# Patient Record
Sex: Female | Born: 1955 | Race: White | Hispanic: No | Smoking: Never smoker
Health system: Southern US, Community
[De-identification: ages and names within clinical notes are randomized; demographics above are authoritative.]

## PROBLEM LIST (undated history)

## (undated) DIAGNOSIS — K259 Gastric ulcer, unspecified as acute or chronic, without hemorrhage or perforation: Secondary | ICD-10-CM

## (undated) DIAGNOSIS — Z860100 Personal history of colon polyps, unspecified: Secondary | ICD-10-CM

## (undated) DIAGNOSIS — I10 Essential (primary) hypertension: Secondary | ICD-10-CM

## (undated) DIAGNOSIS — B009 Herpesviral infection, unspecified: Secondary | ICD-10-CM

## (undated) DIAGNOSIS — J189 Pneumonia, unspecified organism: Secondary | ICD-10-CM

## (undated) DIAGNOSIS — Z8719 Personal history of other diseases of the digestive system: Secondary | ICD-10-CM

## (undated) DIAGNOSIS — E785 Hyperlipidemia, unspecified: Secondary | ICD-10-CM

## (undated) DIAGNOSIS — K449 Diaphragmatic hernia without obstruction or gangrene: Secondary | ICD-10-CM

## (undated) DIAGNOSIS — N816 Rectocele: Secondary | ICD-10-CM

## (undated) DIAGNOSIS — Z87442 Personal history of urinary calculi: Secondary | ICD-10-CM

## (undated) DIAGNOSIS — D229 Melanocytic nevi, unspecified: Secondary | ICD-10-CM

## (undated) DIAGNOSIS — E039 Hypothyroidism, unspecified: Secondary | ICD-10-CM

## (undated) DIAGNOSIS — K5790 Diverticulosis of intestine, part unspecified, without perforation or abscess without bleeding: Secondary | ICD-10-CM

## (undated) HISTORY — PX: WISDOM TOOTH EXTRACTION: SHX21

## (undated) HISTORY — DX: Rectocele: N81.6

## (undated) HISTORY — DX: Diverticulosis of intestine, part unspecified, without perforation or abscess without bleeding: K57.90

## (undated) HISTORY — DX: Gastric ulcer, unspecified as acute or chronic, without hemorrhage or perforation: K25.9

## (undated) HISTORY — PX: DILATION AND CURETTAGE OF UTERUS: SHX78

## (undated) HISTORY — DX: Hypothyroidism, unspecified: E03.9

## (undated) HISTORY — DX: Personal history of other diseases of the digestive system: Z87.19

## (undated) HISTORY — DX: Personal history of urinary calculi: Z87.442

## (undated) HISTORY — DX: Herpesviral infection, unspecified: B00.9

## (undated) HISTORY — PX: POLYPECTOMY: SHX149

## (undated) HISTORY — DX: Pneumonia, unspecified organism: J18.9

## (undated) HISTORY — DX: Essential (primary) hypertension: I10

## (undated) HISTORY — DX: Hyperlipidemia, unspecified: E78.5

## (undated) HISTORY — DX: Personal history of colonic polyps: Z86.010

## (undated) HISTORY — DX: Personal history of colon polyps, unspecified: Z86.0100

## (undated) HISTORY — DX: Morbid (severe) obesity due to excess calories: E66.01

## (undated) HISTORY — PX: APPENDECTOMY: SHX54

---
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
## 1982-11-02 HISTORY — PX: OVARIAN CYST REMOVAL: SHX89

## 1992-11-02 HISTORY — PX: OTHER SURGICAL HISTORY: SHX169

## 2007-04-02 ENCOUNTER — Emergency Department (HOSPITAL_COMMUNITY): Admission: EM | Admit: 2007-04-02 | Discharge: 2007-04-02 | Payer: Self-pay | Admitting: Emergency Medicine

## 2007-08-31 ENCOUNTER — Encounter: Payer: Self-pay | Admitting: Internal Medicine

## 2007-08-31 ENCOUNTER — Ambulatory Visit: Payer: Self-pay | Admitting: Internal Medicine

## 2007-08-31 DIAGNOSIS — N912 Amenorrhea, unspecified: Secondary | ICD-10-CM | POA: Insufficient documentation

## 2007-08-31 DIAGNOSIS — E785 Hyperlipidemia, unspecified: Secondary | ICD-10-CM | POA: Insufficient documentation

## 2007-08-31 DIAGNOSIS — E1159 Type 2 diabetes mellitus with other circulatory complications: Secondary | ICD-10-CM

## 2007-08-31 DIAGNOSIS — I10 Essential (primary) hypertension: Secondary | ICD-10-CM

## 2007-08-31 DIAGNOSIS — F519 Sleep disorder not due to a substance or known physiological condition, unspecified: Secondary | ICD-10-CM | POA: Insufficient documentation

## 2007-08-31 DIAGNOSIS — E039 Hypothyroidism, unspecified: Secondary | ICD-10-CM | POA: Insufficient documentation

## 2007-08-31 DIAGNOSIS — I152 Hypertension secondary to endocrine disorders: Secondary | ICD-10-CM | POA: Insufficient documentation

## 2007-09-01 LAB — CONVERTED CEMR LAB
ALT: 27 units/L (ref 0–35)
AST: 23 units/L (ref 0–37)
Albumin: 4.2 g/dL (ref 3.5–5.2)
Bilirubin, Direct: 0.2 mg/dL (ref 0.0–0.3)
Calcium: 9.4 mg/dL (ref 8.4–10.5)
Chloride: 101 meq/L (ref 96–112)
Cholesterol: 192 mg/dL (ref 0–200)
Creatinine, Ser: 0.7 mg/dL (ref 0.4–1.2)
Eosinophils Relative: 1.8 % (ref 0.0–5.0)
GFR calc non Af Amer: 93 mL/min
Glucose, Bld: 106 mg/dL — ABNORMAL HIGH (ref 70–99)
HCT: 39.7 % (ref 36.0–46.0)
Hgb A1c MFr Bld: 6.3 % — ABNORMAL HIGH (ref 4.6–6.0)
Ketones, ur: NEGATIVE mg/dL
LDL Cholesterol: 127 mg/dL — ABNORMAL HIGH (ref 0–99)
Neutrophils Relative %: 64.6 % (ref 43.0–77.0)
Nitrite: NEGATIVE
Platelets: 239 10*3/uL (ref 150–400)
RBC: 4.45 M/uL (ref 3.87–5.11)
RDW: 13.2 % (ref 11.5–14.6)
Sodium: 138 meq/L (ref 135–145)
Specific Gravity, Urine: 1.01 (ref 1.000–1.03)
Total Bilirubin: 0.8 mg/dL (ref 0.3–1.2)
Total Protein, Urine: NEGATIVE mg/dL
Urine Glucose: NEGATIVE mg/dL
Urobilinogen, UA: 0.2 (ref 0.0–1.0)
VLDL: 24 mg/dL (ref 0–40)
WBC: 7.9 10*3/uL (ref 4.5–10.5)
pH: 7 (ref 5.0–8.0)

## 2007-10-24 ENCOUNTER — Encounter: Payer: Self-pay | Admitting: Internal Medicine

## 2007-11-02 ENCOUNTER — Telehealth: Payer: Self-pay | Admitting: Internal Medicine

## 2007-12-05 ENCOUNTER — Telehealth: Payer: Self-pay | Admitting: Internal Medicine

## 2008-02-02 ENCOUNTER — Telehealth (INDEPENDENT_AMBULATORY_CARE_PROVIDER_SITE_OTHER): Payer: Self-pay | Admitting: *Deleted

## 2008-07-12 ENCOUNTER — Encounter: Admission: RE | Admit: 2008-07-12 | Discharge: 2008-07-12 | Payer: Self-pay | Admitting: Family Medicine

## 2008-07-12 ENCOUNTER — Encounter: Payer: Self-pay | Admitting: Internal Medicine

## 2008-10-04 ENCOUNTER — Encounter: Payer: Self-pay | Admitting: Internal Medicine

## 2008-10-31 ENCOUNTER — Encounter (INDEPENDENT_AMBULATORY_CARE_PROVIDER_SITE_OTHER): Payer: Self-pay | Admitting: *Deleted

## 2008-12-12 ENCOUNTER — Ambulatory Visit: Payer: Self-pay | Admitting: Internal Medicine

## 2008-12-14 ENCOUNTER — Telehealth (INDEPENDENT_AMBULATORY_CARE_PROVIDER_SITE_OTHER): Payer: Self-pay | Admitting: *Deleted

## 2008-12-19 ENCOUNTER — Ambulatory Visit: Payer: Self-pay | Admitting: Internal Medicine

## 2008-12-20 ENCOUNTER — Encounter (INDEPENDENT_AMBULATORY_CARE_PROVIDER_SITE_OTHER): Payer: Self-pay | Admitting: *Deleted

## 2009-02-05 ENCOUNTER — Encounter: Admission: RE | Admit: 2009-02-05 | Discharge: 2009-02-05 | Payer: Self-pay | Admitting: Obstetrics and Gynecology

## 2009-02-15 ENCOUNTER — Encounter: Payer: Self-pay | Admitting: Internal Medicine

## 2009-02-20 ENCOUNTER — Ambulatory Visit: Payer: Self-pay | Admitting: Internal Medicine

## 2009-02-20 DIAGNOSIS — H04129 Dry eye syndrome of unspecified lacrimal gland: Secondary | ICD-10-CM | POA: Insufficient documentation

## 2009-02-20 DIAGNOSIS — D179 Benign lipomatous neoplasm, unspecified: Secondary | ICD-10-CM | POA: Insufficient documentation

## 2009-02-20 DIAGNOSIS — E119 Type 2 diabetes mellitus without complications: Secondary | ICD-10-CM

## 2009-03-08 ENCOUNTER — Telehealth (INDEPENDENT_AMBULATORY_CARE_PROVIDER_SITE_OTHER): Payer: Self-pay | Admitting: *Deleted

## 2009-05-20 ENCOUNTER — Encounter: Payer: Self-pay | Admitting: Internal Medicine

## 2009-05-22 ENCOUNTER — Ambulatory Visit: Payer: Self-pay | Admitting: Internal Medicine

## 2009-08-19 ENCOUNTER — Ambulatory Visit: Payer: Self-pay | Admitting: Internal Medicine

## 2009-09-03 ENCOUNTER — Encounter: Payer: Self-pay | Admitting: Internal Medicine

## 2009-09-03 ENCOUNTER — Ambulatory Visit: Payer: Self-pay | Admitting: Internal Medicine

## 2009-09-03 LAB — HM COLONOSCOPY

## 2009-09-04 ENCOUNTER — Encounter: Payer: Self-pay | Admitting: Internal Medicine

## 2009-10-02 HISTORY — PX: TEAR DUCT PROBING: SHX793

## 2009-11-02 HISTORY — PX: COLONOSCOPY W/ POLYPECTOMY: SHX1380

## 2009-11-18 ENCOUNTER — Encounter: Payer: Self-pay | Admitting: Internal Medicine

## 2009-11-21 ENCOUNTER — Ambulatory Visit: Payer: Self-pay | Admitting: Internal Medicine

## 2009-11-21 DIAGNOSIS — Z860100 Personal history of colon polyps, unspecified: Secondary | ICD-10-CM | POA: Insufficient documentation

## 2009-11-21 DIAGNOSIS — IMO0001 Reserved for inherently not codable concepts without codable children: Secondary | ICD-10-CM | POA: Insufficient documentation

## 2010-02-10 ENCOUNTER — Encounter: Admission: RE | Admit: 2010-02-10 | Discharge: 2010-02-10 | Payer: Self-pay | Admitting: Internal Medicine

## 2010-02-10 LAB — HM MAMMOGRAPHY: HM Mammogram: NORMAL

## 2010-03-04 ENCOUNTER — Encounter (INDEPENDENT_AMBULATORY_CARE_PROVIDER_SITE_OTHER): Payer: Self-pay | Admitting: *Deleted

## 2010-03-04 ENCOUNTER — Ambulatory Visit: Payer: Self-pay | Admitting: Internal Medicine

## 2010-03-04 DIAGNOSIS — IMO0002 Reserved for concepts with insufficient information to code with codable children: Secondary | ICD-10-CM | POA: Insufficient documentation

## 2010-03-04 DIAGNOSIS — R350 Frequency of micturition: Secondary | ICD-10-CM | POA: Insufficient documentation

## 2010-03-04 LAB — CONVERTED CEMR LAB
Bilirubin Urine: NEGATIVE
Blood in Urine, dipstick: NEGATIVE
Ketones, urine, test strip: NEGATIVE
Nitrite: NEGATIVE
Protein, U semiquant: NEGATIVE
Urobilinogen, UA: 0.2

## 2010-11-17 ENCOUNTER — Other Ambulatory Visit: Payer: Self-pay | Admitting: Internal Medicine

## 2010-11-17 ENCOUNTER — Ambulatory Visit
Admission: RE | Admit: 2010-11-17 | Discharge: 2010-11-17 | Payer: Self-pay | Source: Home / Self Care | Attending: Internal Medicine | Admitting: Internal Medicine

## 2010-11-17 DIAGNOSIS — R5381 Other malaise: Secondary | ICD-10-CM | POA: Insufficient documentation

## 2010-11-17 DIAGNOSIS — R5383 Other fatigue: Secondary | ICD-10-CM

## 2010-11-17 LAB — HEPATIC FUNCTION PANEL
ALT: 23 U/L (ref 0–35)
AST: 21 U/L (ref 0–37)
Albumin: 4.4 g/dL (ref 3.5–5.2)
Alkaline Phosphatase: 92 U/L (ref 39–117)
Bilirubin, Direct: 0.2 mg/dL (ref 0.0–0.3)
Total Bilirubin: 1 mg/dL (ref 0.3–1.2)
Total Protein: 7.1 g/dL (ref 6.0–8.3)

## 2010-11-17 LAB — CBC WITH DIFFERENTIAL/PLATELET
Basophils Absolute: 0 10*3/uL (ref 0.0–0.1)
Basophils Relative: 0.7 % (ref 0.0–3.0)
Eosinophils Absolute: 0.1 10*3/uL (ref 0.0–0.7)
Eosinophils Relative: 1.4 % (ref 0.0–5.0)
HCT: 45.4 % (ref 36.0–46.0)
Hemoglobin: 15.1 g/dL — ABNORMAL HIGH (ref 12.0–15.0)
Lymphocytes Relative: 28.4 % (ref 12.0–46.0)
Lymphs Abs: 1.9 10*3/uL (ref 0.7–4.0)
MCHC: 33.3 g/dL (ref 30.0–36.0)
MCV: 89.8 fl (ref 78.0–100.0)
Monocytes Absolute: 0.5 10*3/uL (ref 0.1–1.0)
Monocytes Relative: 6.7 % (ref 3.0–12.0)
Neutro Abs: 4.2 10*3/uL (ref 1.4–7.7)
Neutrophils Relative %: 62.8 % (ref 43.0–77.0)
Platelets: 221 10*3/uL (ref 150.0–400.0)
RBC: 5.05 Mil/uL (ref 3.87–5.11)
RDW: 14.2 % (ref 11.5–14.6)
WBC: 6.7 10*3/uL (ref 4.5–10.5)

## 2010-11-17 LAB — BUN: BUN: 18 mg/dL (ref 6–23)

## 2010-11-17 LAB — CREATININE, SERUM: Creatinine, Ser: 0.7 mg/dL (ref 0.4–1.2)

## 2010-11-17 LAB — LIPID PANEL
Cholesterol: 211 mg/dL — ABNORMAL HIGH (ref 0–200)
HDL: 40.9 mg/dL (ref >39.00)
Total CHOL/HDL Ratio: 5
Triglycerides: 193 mg/dL — ABNORMAL HIGH (ref 0.0–149.0)
VLDL: 38.6 mg/dL (ref 0.0–40.0)

## 2010-11-17 LAB — POTASSIUM: Potassium: 4.7 mEq/L (ref 3.5–5.1)

## 2010-11-17 LAB — LDL CHOLESTEROL, DIRECT: Direct LDL: 148.6 mg/dL

## 2010-11-19 ENCOUNTER — Telehealth: Payer: Self-pay | Admitting: Internal Medicine

## 2010-11-20 ENCOUNTER — Telehealth (INDEPENDENT_AMBULATORY_CARE_PROVIDER_SITE_OTHER): Payer: Self-pay | Admitting: *Deleted

## 2010-11-30 LAB — CONVERTED CEMR LAB
Creatinine,U: 39.7 mg/dL
Hgb A1c MFr Bld: 7.3 % — ABNORMAL HIGH (ref 4.6–6.0)
Microalb, Ur: 0.7 mg/dL (ref 0.0–1.9)
Potassium: 4 meq/L (ref 3.5–5.1)
TSH: 2.41 microintl units/mL (ref 0.35–5.50)
Total CK: 73 units/L (ref 7–177)

## 2010-12-02 ENCOUNTER — Telehealth: Payer: Self-pay | Admitting: Internal Medicine

## 2010-12-04 NOTE — Assessment & Plan Note (Signed)
Summary: FOR A FOLLOW UP PH   Vital Signs:  Patient profile:   55 year old female Weight:      269.4 pounds BMI:     42.35 Pulse rate:   64 / minute Resp:     15 per minute BP sitting:   138 / 92  (left arm) Cuff size:   large  Vitals Entered By: Shonna Chock CMA (November 17, 2010 8:13 AM) CC: Follow-up visit: Discuss NMR , Type 2 diabetes mellitus follow-up   CC:  Follow-up visit: Discuss NMR  and Type 2 diabetes mellitus follow-up.  History of Present Illness:      This is a 55 year old woman who presents for Type 2 diabetes mellitus follow-up.  The patient denies polyuria, polydipsia, blurred vision, self managed hypoglycemia, and numbness of extremities.  The patient denies the following symptoms: neuropathic pain, chest pain, vomiting, orthostatic symptoms, poor wound healing, intermittent claudication, vision loss, and foot ulcer.  Since the last visit the patient reports poor dietary compliance & 15# weight gain, exercising regularly (walking >3X/week), and monitoring blood glucose.  The patient has been measuring capillary blood glucose before breakfast (109-130). Two hrs after largest meal = < 140.  Since the last visit, the patient reports having had eye care by an ophthalmologist, no retinopathy was present. Microalbuminuria  has increased.Goals & risks discussed.                                                                                                                                                              She questions inadequate thyroid replacement due to + ROS as noted. Her mother was diagnosed with lymphoma @ 58 &  her MGF also had lymphoma as well ; this concerns her as she has been fatigued.  Current Medications (verified): 1)  Levoxyl 150 Mcg Tabs (Levothyroxine Sodium) .Marland Kitchen.. 1 By Mouth Once Daily**appointment Due** 2)  Metformin Hcl 500 Mg Xr24h-Tab (Metformin Hcl) .Marland Kitchen.. 1qd With Largest Meal 3)  Onetouch Ultra Test  Strp (Glucose Blood) .... Use As Directed 4)   Onetouch Lancets  Misc (Lancets) .... Use As Directed 5)  Melatonin 5 Mg Caps (Melatonin) .Marland Kitchen.. 1 By Mouth At Bedtime 6)  Vitamin D3 2000 Unit Caps (Cholecalciferol) .Marland Kitchen.. 1 By Mouth Once Daily 7)  Miralax Cap .Marland Kitchen.. 1 By Mouth Once Daily  Allergies: 1)  ! Meperidine Hcl (Meperidine Hcl) 2)  ! Erythromycin Base (Erythromycin Base) 3)  ! Codeine Sulfate (Codeine Sulfate) 4)  ! Cozaar  Review of Systems General:  Complains of fatigue; denies loss of appetite and sleep disorder. CV:  Denies palpitations. GI:  Complains of constipation. Derm:  Complains of changes in nail beds, dryness, and hair loss; Thinning  of eyebrows; nails brittle.  Physical Exam  General:  well-nourished,in  no acute distress; alert,appropriate and cooperative throughout examination Neck:  No deformities, masses, or tenderness noted. Lungs:  Normal respiratory effort, chest expands symmetrically. Lungs are clear to auscultation, no crackles or wheezes. Heart:  Normal rate and regular rhythm. S1 and S2 normal without gallop, murmur, click, rub.S4 Abdomen:  Bowel sounds positive,abdomen soft and non-tender without masses, organomegaly or hernias noted. Pulses:  R and L carotid,radial,dorsalis pedis and posterior tibial pulses are full and equal bilaterally Extremities:  No clubbing, cyanosis, edema, or deformity noted . Good nail health Neurologic:  alert & oriented X3, sensation intact to light touch over feet , and DTRs symmetrical and normal.   Skin:  Intact without suspicious lesions or rashes Cervical Nodes:  No lymphadenopathy noted Axillary Nodes:  No palpable lymphadenopathy Psych:  memory intact for recent and remote, normally interactive, and good eye contact.     Impression & Recommendations:  Problem # 1:  DIAB W/O COMP TYPE II/UNS NOT STATED UNCNTRL (ICD-250.00)  Her updated medication list for this problem includes:    Metformin Hcl 500 Mg Xr24h-tab (Metformin hcl) .Marland Kitchen... 1qd with largest  meal  Problem # 2:  FATIGUE (ICD-780.79)  Orders: Venipuncture (04540) TLB-CBC Platelet - w/Differential (85025-CBCD) TLB-Hepatic/Liver Function Pnl (80076-HEPATIC) TLB-Creatinine, Blood (82565-CREA) TLB-Potassium (K+) (84132-K) TLB-BUN (Urea Nitrogen) (84520-BUN) Specimen Handling (98119)  Problem # 3:  HYPOTHYROIDISM (ICD-244.9)  Her updated medication list for this problem includes:    Levoxyl 150 Mcg Tabs (Levothyroxine sodium) .Marland Kitchen... 1 by mouth once daily except 1& 1/2 on weds  Problem # 4:  LYMPHOMA, FAMILY HX (ICD-V16.7)  Orders: Venipuncture (14782) TLB-CBC Platelet - w/Differential (85025-CBCD) Specimen Handling (95621)  Problem # 5:  HYPERLIPIDEMIA (ICD-272.4)  Orders: Venipuncture (30865) TLB-Hepatic/Liver Function Pnl (80076-HEPATIC) TLB-Lipid Panel (80061-LIPID)  Complete Medication List: 1)  Levoxyl 150 Mcg Tabs (Levothyroxine sodium) .Marland Kitchen.. 1 by mouth once daily except 1& 1/2 on weds 2)  Metformin Hcl 500 Mg Xr24h-tab (Metformin hcl) .Marland Kitchen.. 1qd with largest meal 3)  Onetouch Ultra Test Strp (Glucose blood) .... Use as directed 4)  Onetouch Lancets Misc (Lancets) .... Use as directed 5)  Melatonin 5 Mg Caps (Melatonin) .Marland Kitchen.. 1 by mouth at bedtime 6)  Vitamin D3 2000 Unit Caps (Cholecalciferol) .Marland Kitchen.. 1 by mouth once daily 7)  Miralax Cap  .Marland Kitchen.. 1 by mouth once daily  Patient Instructions: 1)  Check TSH after increased dose as Rxed  in 8-9 weeks (244.9). Prescriptions: ONETOUCH LANCETS  MISC (LANCETS) use as directed  #100 x 3   Entered and Authorized by:   Marga Melnick MD   Signed by:   Marga Melnick MD on 11/17/2010   Method used:   Print then Give to Patient   RxID:   [id number] ONETOUCH ULTRA TEST  STRP (GLUCOSE BLOOD) Use as directed  #100 x 3   Entered and Authorized by:   Marga Melnick MD   Signed by:   Marga Melnick MD on 11/17/2010   Method used:   Print then Give to Patient   RxID:   [id number] LEVOXYL 150 MCG TABS (LEVOTHYROXINE  SODIUM) 1 by mouth once daily except 1& 1/2 on Weds  #90 x 1   Entered and Authorized by:   Marga Melnick MD   Signed by:   Marga Melnick MD on 11/17/2010   Method used:   Print then Give to Patient   RxID:   [id number]    Orders Added: 1)  Est. Patient Level IV [95188] 2)  Venipuncture [41660] 3)  TLB-CBC Platelet -  w/Differential [85025-CBCD] 4)  TLB-Hepatic/Liver Function Pnl [80076-HEPATIC] 5)  TLB-Creatinine, Blood [82565-CREA] 6)  TLB-Potassium (K+) [84132-K] 7)  TLB-BUN (Urea Nitrogen) [84520-BUN] 8)  TLB-Lipid Panel [80061-LIPID] 9)  Specimen Handling [99000]

## 2010-12-04 NOTE — Progress Notes (Signed)
Summary: question regarding new dose on Wed for Levoxyl  Phone Note Call from Patient Call back at cell = [phone]   Caller: Patient Summary of Call: patient says she was told to take an extra "half a pill" on Wednesday of her Thyroid medication--she says that this extra boost on Wednesday makes her feel terrible---she asks if she can get a prescription  for 25 m c g and break those in half and take one-half pill  (12.5 every day)    (her math is 12.5 x 7 = 87.5),     BUT----she is basing her math on her Thyroid dose of 175 m c g  , not 150 m c g  (one-half of 175 = 87.5)  please [facility] [place], [place] Initial call taken by: Jerolyn Shin,  November 19, 2010 11:22 AM  Follow-up for Phone Call        Please advise. Follow-up by: Lucious Groves CMA,  November 19, 2010 11:31 AM  Additional Follow-up for Phone Call Additional follow up Details #1::        Change to 150 micrograms once daily PLUS 1/2 of 25 micrograms once daily ; check TSH in 9 weeks with A1c (244.9, 250.00). See Rx to FAX Additional Follow-up by: Marga Melnick MD,  November 20, 2010 6:41 AM    Additional Follow-up for Phone Call Additional follow up Details #2::    Patient aware of the above and most recent lab results. Sent Pravastatin per patient request. Lucious Groves CMA  November 20, 2010 9:51 AM   New/Updated Medications: LEVOXYL 150 MCG TABS (LEVOTHYROXINE SODIUM) 1 by mouth once daily  plus  1/2  of 25 micrograms pill once daily LEVOXYL 25 MCG TABS (LEVOTHYROXINE SODIUM) 1/2 once daily with 150 micrograms pill Prescriptions: PRAVASTATIN SODIUM 20 MG TABS (PRAVASTATIN SODIUM) 1 at bedtime  #30 x 2   Entered by:   Lucious Groves CMA   Authorized by:   Marga Melnick MD   Signed by:   Lucious Groves CMA on 11/20/2010   Method used:   Electronically to        [facility]  [phone]* (retail)       [address] [place], [place]  [address]       Ph: [phone] or  [phone]       Fax: [phone]   RxID:   [id number] LEVOXYL 150 MCG TABS (LEVOTHYROXINE SODIUM) 1 by mouth once daily  plus  1/2  of 25 micrograms pill once daily  #90 x 0   Entered by:   Lucious Groves CMA   Authorized by:   Marga Melnick MD   Signed by:   Lucious Groves CMA on 11/20/2010   Method used:   Electronically to        [facility]  [phone]* (retail)       [address] [place], [place]  [address]       Ph: [phone] or [phone]       Fax: [phone]   RxID:   [id number] LEVOXYL 25 MCG TABS (LEVOTHYROXINE SODIUM) 1/2 once daily with 150 micrograms pill  #45 x 0   Entered by:   Lucious Groves CMA   Authorized by:   Marga Melnick MD   Signed by:   Lucious Groves CMA on 11/20/2010   Method used:   Electronically to  [facility]  [phone]* (retail)       [address] [place], [place]  [address]       Ph: [phone] or [phone]       Fax: [phone]   RxID:   [id number]

## 2010-12-04 NOTE — Progress Notes (Signed)
Summary: will have labs drawn at [facility] for 3/14 O/V with Hopp  ---- Converted from flag ---- ---- 11/20/2010 9:55 AM, Lucious Groves CMA wrote: I just spoke with the patient and she notes that she had an appt in 9 weeks. Patient needs lab appt prior to that for (Lipids, hepatic panel, TSH, and A1c; 272.4, 995.20, 244.9, 250.00). Please call her to schedule. [phone] ------------------------------       Additional Follow-up for Phone Call Additional follow up Details #2::    called patient to schedule these labs before she sees Dr Alwyn Ren on 3/14----she says that she always has these labs at [facility] where she works and she will bring results to the 3/14 office visit with Dr Alwyn Ren Follow-up by: Jerolyn Shin,  November 20, 2010 4:53 PM
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
## 2010-12-04 NOTE — Letter (Signed)
Summary: Work Dietitian at [employer]  [address] [place], [place] [address]   Phone: [phone]  Fax: [phone]    Today's Date: Mar 04, 2010  Name of Patient: Hannah Parsons  The above named patient had a medical visit today at:  8:30am / pm.  Please take this into consideration when reviewing the time away from work/school.    Special Instructions:  [ X ] None  [  ] To be off the remainder of today, returning to the normal work / school schedule tomorrow.  [  ] To be off until the next scheduled appointment on ______________________.  [  ] Other ________________________________________________________________ ________________________________________________________________________   Sincerely yours,   Shonna Chock

## 2010-12-04 NOTE — Assessment & Plan Note (Signed)
Summary: blood in ear/cbs   Vital Signs:  Patient profile:   55 year old female Weight:      258.6 pounds BMI:     40.65 Temp:     98.6 degrees F oral Pulse rate:   76 / minute Resp:     16 per minute BP sitting:   130 / 82  (left arm) Cuff size:   large  Vitals Entered By: Shonna Chock (Mar 04, 2010 8:39 AM) CC: Left ear feels funny but right ear had blood in it, patient noticed today.  Comments REVIEWED MED LIST, PATIENT AGREED DOSE AND INSTRUCTION CORRECT    CC:  Left ear feels funny but right ear had blood in it and patient noticed today. Marland Kitchen  History of Present Illness: Scant bright red blood noted on Q tip this am; she had noted itching prompting her to employ the Q tip. She is concerned as leaving for [facility] , [place] in am.She uses Eucerin as needed for eczema.  Allergies: 1)  ! Meperidine Hcl (Meperidine Hcl) 2)  ! Erythromycin Base (Erythromycin Base) 3)  ! Codeine Sulfate (Codeine Sulfate) 4)  ! Cozaar  Review of Systems General:  Denies chills, fever, sweats, and weight loss. ENT:  Denies earache, nasal congestion, nosebleeds, and sinus pressure; No frontal headache , facial pain or purulence. Resp:  Denies cough, coughing up blood, and sputum productive. GI:  Denies bloody stools and dark tarry stools. GU:  Complains of urinary frequency; denies discharge, dysuria, and hematuria; Nocturia X 1. Heme:  Denies abnormal bruising and bleeding.  Physical Exam  General:  n no acute distress; alert,appropriate and cooperative throughout examination Ears:  R external ear exam shows  tiny area of resolved hemorrhage inferior. Otoscopic examination reveals clear canals, tympanic membranes are intact bilaterally without bulging, retraction, inflammation or discharge. Hearing is grossly normal bilaterally. TMs dull Nose:  External nasal examination shows no deformity or inflammation. Nasal mucosa are pink and moist without lesions or exudates. Mouth:  Oral mucosa and oropharynx  without lesions or exudates.  Teeth in good repair. Lungs:  Normal respiratory effort, chest expands symmetrically. Lungs are clear to auscultation, no crackles or wheezes. Skin:  Intact without suspicious lesions or rashes Cervical Nodes:  No lymphadenopathy noted Axillary Nodes:  No palpable lymphadenopathy   Impression & Recommendations:  Problem # 1:  ABRASION (ICD-919.0) minor external canal hemorrhage (resolved),probably from foreign body being  rubbed against dry  tissues by Q tip  Problem # 2:  URINARY FREQUENCY (ICD-788.41)  Orders: UA Dipstick w/o Micro (manual) (81191)  Complete Medication List: 1)  Levoxyl 150 Mcg Tabs (Levothyroxine sodium) .Marland Kitchen.. 1 by mouth once daily 2)  Metformin Hcl 500 Mg Xr24h-tab (Metformin hcl) .Marland Kitchen.. 1qd with largest meal 3)  Onetouch Ultra Test Strp (Glucose blood) .... Use as directed 4)  Onetouch Lancets Misc (Lancets) .... Use as directed 5)  Melatonin 5 Mg Caps (Melatonin) .Marland Kitchen.. 1 by mouth at bedtime  Patient Instructions: 1)  Discontinue Q tips; use CortAid or Eucerin as needed  for itching in ear  canal. 2)  Drink as much fluid as you can tolerate for the next few days.  Laboratory Results   Urine Tests    Routine Urinalysis   Color: yellow Appearance: Clear Glucose: negative   (Normal Range: Negative) Bilirubin: negative   (Normal Range: Negative) Ketone: negative   (Normal Range: Negative) Spec. Gravity: 1.010   (Normal Range: 1.003-1.035) Blood: negative   (Normal Range: Negative) pH: 6.0   (  Normal Range: 5.0-8.0) Protein: negative   (Normal Range: Negative) Urobilinogen: 0.2   (Normal Range: 0-1) Nitrite: negative   (Normal Range: Negative) Leukocyte Esterace: negative   (Normal Range: Negative)

## 2010-12-08 ENCOUNTER — Encounter: Payer: Self-pay | Admitting: Internal Medicine

## 2010-12-08 ENCOUNTER — Ambulatory Visit (INDEPENDENT_AMBULATORY_CARE_PROVIDER_SITE_OTHER): Payer: 59 | Admitting: Internal Medicine

## 2010-12-08 DIAGNOSIS — E039 Hypothyroidism, unspecified: Secondary | ICD-10-CM

## 2010-12-08 DIAGNOSIS — I10 Essential (primary) hypertension: Secondary | ICD-10-CM

## 2010-12-08 DIAGNOSIS — N852 Hypertrophy of uterus: Secondary | ICD-10-CM

## 2010-12-08 DIAGNOSIS — D259 Leiomyoma of uterus, unspecified: Secondary | ICD-10-CM

## 2010-12-08 DIAGNOSIS — E785 Hyperlipidemia, unspecified: Secondary | ICD-10-CM

## 2010-12-08 DIAGNOSIS — Z01818 Encounter for other preprocedural examination: Secondary | ICD-10-CM

## 2010-12-10 NOTE — Progress Notes (Signed)
Summary: Thyroid rx--[facility]  Phone Note From Pharmacy   Caller: [facility] Summary of Call: MD spoke with [facility] today about patient thyroid meds and it is to be dispensed how he wrote it. [facility] aware and will shipt #90 of both doses to the patient. Lucious Groves CMA  December 02, 2010 11:07 AM

## 2010-12-18 NOTE — Assessment & Plan Note (Signed)
Summary: clearanc for surgery/cbs   Vital Signs:  Patient profile:   55 year old female Height:      66.5 inches Weight:      265.8 pounds Temp:     98.4 degrees F oral Pulse rate:   76 / minute Resp:     14 per minute BP sitting:   132 / 88  (left arm) Cuff size:   large  Vitals Entered By: Shonna Chock CMA (December 08, 2010 9:04 AM) CC: Surgical clearance- Total Hysterectomy, Pre-op Evaluation   CC:  Surgical clearance- Total Hysterectomy and Pre-op Evaluation.  History of Present Illness:    Dr Doug Sou , [place] , [place] has requested a pre op evaluation . Laparoscopic surgey is planned 12/29/2010 for irregular vaginal bleeding due to fibroids &  uterine hyperplasia.She  denies respiratory , GI bleeding,   cardiac symptoms,  significant  ETOH use or  smoking. See ROS . She is on  diabetes meds.  There is no history of antiplatelet agents, chronic steroids ( steroid injection last week R foot), warfarin, bleeding  or clotting disorder ( except for the Gyn issues), and PMH of  anesthesia reaction.  She is walking 1 mpd @ 2.4 mph  with incline of 2% once daily w/o symptoms. Her father & PGM had CHF; no FH MI or CVA.  Allergies: 1)  ! Meperidine Hcl (Meperidine Hcl) 2)  ! Erythromycin Base (Erythromycin Base) 3)  ! Codeine Sulfate (Codeine Sulfate) 4)  ! Cozaar  Review of Systems General:  Denies chills, fatigue, fever, and sweats; Weight loss is purposeful. ENT:  Denies difficulty swallowing, hoarseness, and nosebleeds. CV:  Denies chest pain or discomfort, difficulty breathing at night, difficulty breathing while lying down, leg cramps with exertion, palpitations, shortness of breath with exertion, swelling of feet, and swelling of hands. Resp:  Denies cough, coughing up blood, shortness of breath, and sputum productive. GI:  Denies abdominal pain, bloody stools, dark tarry stools, indigestion, nausea, and vomiting. GU:  Denies discharge, dysuria, and hematuria. Heme:  Denies  abnormal bruising.  Physical Exam  General:  well-nourished,in no acute distress; alert,appropriate and cooperative throughout examination Eyes:  No corneal or conjunctival inflammation noted. No icterus Mouth:  Oral mucosa and oropharynx without lesions or exudates.  Teeth in good repair. No pharyngeal erythema.   Neck:  No deformities, masses, or tenderness noted. Lungs:  Normal respiratory effort, chest expands symmetrically. Lungs are clear to auscultation, no crackles or wheezes. Heart:  Normal rate and regular rhythm. S1 and S2 normal without gallop, murmur, click, rub . S4 Abdomen:  Bowel sounds positive,abdomen soft and non-tender without masses, organomegaly or hernias noted. Pulses:  R and L carotid,radial,dorsalis pedis and posterior tibial pulses are full and equal bilaterally Extremities:  No clubbing, cyanosis, edema. Neurologic:  alert & oriented X3 and DTRs symmetrical and normal.   Skin:  Intact without suspicious lesions or rashes Cervical Nodes:  No lymphadenopathy noted Axillary Nodes:  No palpable lymphadenopathy Psych:  memory intact for recent and remote, normally interactive, and good eye contact.     Impression & Recommendations:  Problem # 1:  PREOPERATIVE EXAMINATION (ICD-V72.84)  Problem # 2:  FIBROIDS, UTERUS (ICD-218.9)  Problem # 3:  HYPERTROPHY OF UTERUS (ICD-621.2)  Problem # 4:  DIAB W/O COMP TYPE II/UNS NOT STATED UNCNTRL (ICD-250.00)  Her updated medication list for this problem includes:    Metformin Hcl 500 Mg Xr24h-tab (Metformin hcl) .Marland Kitchen... 1qd with largest meal  Problem #  5:  HYPOTHYROIDISM (ICD-244.9) thyroid dose recently adjusted Her updated medication list for this problem includes:    Levoxyl 150 Mcg Tabs (Levothyroxine sodium) .Marland Kitchen... 1 by mouth once daily  plus  1/2  of 25 micrograms pill once daily    Levoxyl 25 Mcg Tabs (Levothyroxine sodium) .Marland Kitchen... 1/2 once daily with 150 micrograms pill  Problem # 6:  HYPERTENSION (ICD-401.9)   Good BP control.Minor new  NS ST-T changes ( not diagnosed by computer) ; no symptoms with CVE (treadmill)  Problem # 7:  HYPERLIPIDEMIA (ICD-272.4)  Her updated medication list for this problem includes:    Pravastatin Sodium 20 Mg Tabs (Pravastatin sodium) .Marland Kitchen... 1 at bedtime  Complete Medication List: 1)  Levoxyl 150 Mcg Tabs (Levothyroxine sodium) .Marland Kitchen.. 1 by mouth once daily  plus  1/2  of 25 micrograms pill once daily 2)  Metformin Hcl 500 Mg Xr24h-tab (Metformin hcl) .Marland Kitchen.. 1qd with largest meal 3)  Onetouch Ultra Test Strp (Glucose blood) .... Use as directed 4)  Onetouch Lancets Misc (Lancets) .... Use as directed 5)  Melatonin 5 Mg Caps (Melatonin) .Marland Kitchen.. 1 by mouth at bedtime 6)  Vitamin D3 2000 Unit Caps (Cholecalciferol) .Marland Kitchen.. 1 by mouth once daily 7)  Miralax Cap  .Marland Kitchen.. 1 by mouth once daily 8)  Pravastatin Sodium 20 Mg Tabs (Pravastatin sodium) .Marland Kitchen.. 1 at bedtime 9)  Levoxyl 25 Mcg Tabs (Levothyroxine sodium) .... 1/2 once daily with 150 micrograms pill  Other Orders: EKG w/ Interpretation (93000)  Patient Instructions: 1)  Tight Diabetes control should be avoided perioperatively ( see below). Telemetry required perioperatively. Share records with Dr Doug Sou. 2)  Check your blood sugars regularly. If your readings are usually above :150  or below 90 you should contact our office.   Orders Added: 1)  Est. Patient Level IV [09811] 2)  EKG w/ Interpretation [93000]

## 2010-12-22 ENCOUNTER — Telehealth: Payer: Self-pay | Admitting: Internal Medicine

## 2010-12-30 NOTE — Progress Notes (Signed)
Summary: Forms  Phone Note Call from Patient Call back at [phone]   Caller: Patient Summary of Call: PT IS CALLING ABOUT FORMS THAT SHE SAID WERE SUPPOSED TO BE FAXED FOR HER SURGERY. PLEASE CONTACT PATIENT AT [phone] EXT [phone] Initial call taken by: Lavell Islam,  December 22, 2010 10:51 AM  Follow-up for Phone Call        Please advise. Lucious Groves CMA  December 22, 2010 10:55 AM   Additional Follow-up for Phone Call Additional follow up Details #1::        FAX last OV clearing her for surgery Additional Follow-up by: Marga Melnick MD,  December 22, 2010 1:51 PM    Additional Follow-up for Phone Call Additional follow up Details #2::    Patient notified and states that there are specific forms and she will have them fax it to me. Please fax notes to Gearldine Bienenstock at [phone]. Lucious Groves CMA  December 22, 2010 2:45 PM

## 2011-01-14 ENCOUNTER — Ambulatory Visit: Payer: Self-pay | Admitting: Internal Medicine

## 2011-01-26 ENCOUNTER — Encounter (INDEPENDENT_AMBULATORY_CARE_PROVIDER_SITE_OTHER): Payer: Self-pay | Admitting: *Deleted

## 2011-01-28 ENCOUNTER — Encounter: Payer: Self-pay | Admitting: Internal Medicine

## 2011-01-28 ENCOUNTER — Ambulatory Visit (INDEPENDENT_AMBULATORY_CARE_PROVIDER_SITE_OTHER): Payer: 59 | Admitting: Internal Medicine

## 2011-01-28 DIAGNOSIS — E785 Hyperlipidemia, unspecified: Secondary | ICD-10-CM

## 2011-01-28 DIAGNOSIS — E039 Hypothyroidism, unspecified: Secondary | ICD-10-CM

## 2011-01-28 DIAGNOSIS — E119 Type 2 diabetes mellitus without complications: Secondary | ICD-10-CM

## 2011-01-28 NOTE — Progress Notes (Signed)
  Subjective:    Patient ID: Hannah Parsons, female    DOB: Mar 24, 1956, 55 y.o.   MRN: [id number]  HPI she returns for followup of labs. Her thyroid dose has been increased to 1 pill 150 mcg daily +1/2 of a 25 mcg pill daily for a total dose of 162.5 mcg. Her TSH is in the ideal range at 1.5-1.  Followup lipids revealed an LDL of 112. Triglycerides are still elevated at 200. HDL is 40; goals were discussed. Specifically  HDL goal = greater than 50; triglycerides goal is < 150; and LDL less than 100, ideally less than 70.  Pravastatin 20 mg was recommended after the last visit  based on presence of  DM & LDL value. She categorically states that she does not want to take statins unless  the elevated lipids are   "life threatening".    Review of Systems     Objective:   Physical Exam  on exam she is well-nourished; in no obvious distress. Skin is warm and dry w/o lesions.  She exhibits an S4 without murmurs or gallops.  All pulses are intact; no bruits are noted. She is intelligent and focused.  Light touch is normal over  the feet. Nail health is normal.          Assessment & Plan:  #1 hypothyroidism well controlled on the present dose  #2 diabetes; A1c of  6.4 indicates excellent control  #3 dyslipidemia; the standard would be an LDL less than 100 ideally less than 70 and HDL over 50. She exhibits an aversion to statins.  Plan: #1 I will recommend that an advanced cholesterol panel ([place] heart panel, 1304X) be performed here in 10 weeks. An A1c to be done through her office at that time. I would ask her to see me after we get the results of the events panel and she's had a chance to review it. This would optimally assess her genetic long-term cardiovascular risk.

## 2011-01-28 NOTE — Patient Instructions (Signed)
In 10 weeks please have an A1c done at you  office. (250.00).  At the same time please drop by for a fasting advanced cholesterol panel ([place] heart panel, 1304X). The code for this test would be 272.4 and 250.00. Please bring the booklet on the [facility] heart panel and the NMR lipoprotein to the followup visit.

## 2011-02-03 ENCOUNTER — Encounter: Payer: Self-pay | Admitting: Internal Medicine

## 2011-02-03 NOTE — Letter (Signed)
Summary: New Patient letter  [facility]  [address]   [place], [place] [address]   Phone: [phone]  Fax: [phone]       01/26/2011 MRN: [id number]  [facility] [address] [place], [place]  [address]  [place]  Dear Ms. Hannah Parsons,  Welcome to the Gastroenterology Division at [facility].    You are scheduled to see Dr.  Marina Goodell on 03/10/2011 at 9:15 on the 3rd floor at [facility], [address]  We ask that you try to arrive at our office 15 minutes prior to your appointment time to allow for check-in.  We would like you to complete the enclosed self-administered evaluation form prior to your visit and bring it with you on the day of your appointment.  We will review it with you.  Also, please bring a complete list of all your medications or, if you prefer, bring the medication bottles and we will list them.  Please bring your insurance card so that we may make a copy of it.  If your insurance requires a referral to see a specialist, please bring your referral form from your primary care physician.  Co-payments are due at the time of your visit and may be paid by cash, check or credit card.     Your office visit will consist of a consult with your physician (includes a physical exam), any laboratory testing he/she may order, scheduling of any necessary diagnostic testing (e.g. x-ray, ultrasound, CT-scan), and scheduling of a procedure (e.g. Endoscopy, Colonoscopy) if required.  Please allow enough time on your schedule to allow for any/all of these possibilities.    If you cannot keep your appointment, please call [phone] to cancel or reschedule prior to your appointment date.  This allows [place] the opportunity to schedule an appointment for another patient in need of care.  If you do not cancel or reschedule by 5 p.m. the business day prior to your appointment date, you will be charged a $50.00 late cancellation/no-show fee.    Thank you for choosing  [facility] for your medical needs.  We appreciate the opportunity to care for you.  Please visit [place] at our website  to learn more about our practice.                     Sincerely,                                                             The Gastroenterology Division

## 2011-02-04 LAB — GLUCOSE, CAPILLARY
Glucose-Capillary: 100 mg/dL — ABNORMAL HIGH (ref 70–99)
Glucose-Capillary: 87 mg/dL (ref 70–99)

## 2011-02-05 ENCOUNTER — Other Ambulatory Visit: Payer: Self-pay | Admitting: *Deleted

## 2011-02-05 MED ORDER — LEVOTHYROXINE SODIUM 25 MCG PO TABS: 25.0000 ug | ORAL_TABLET | Freq: Every day | ORAL | Status: DC

## 2011-02-05 MED ORDER — LEVOTHYROXINE SODIUM 150 MCG PO TABS: 150.0000 ug | ORAL_TABLET | Freq: Every day | ORAL | Status: DC

## 2011-02-24 ENCOUNTER — Other Ambulatory Visit: Payer: Self-pay | Admitting: *Deleted

## 2011-02-24 MED ORDER — METFORMIN HCL ER 500 MG PO TB24: 500.0000 mg | ORAL_TABLET | Freq: Every day | ORAL | Status: DC

## 2011-03-10 ENCOUNTER — Ambulatory Visit (INDEPENDENT_AMBULATORY_CARE_PROVIDER_SITE_OTHER): Payer: 59 | Admitting: Internal Medicine

## 2011-03-10 ENCOUNTER — Encounter: Payer: Self-pay | Admitting: Internal Medicine

## 2011-03-10 VITALS — BP 128/90 | HR 80 | Ht 66.0 in | Wt 272.0 lb

## 2011-03-10 DIAGNOSIS — K5900 Constipation, unspecified: Secondary | ICD-10-CM

## 2011-03-10 DIAGNOSIS — E119 Type 2 diabetes mellitus without complications: Secondary | ICD-10-CM

## 2011-03-10 DIAGNOSIS — R1310 Dysphagia, unspecified: Secondary | ICD-10-CM

## 2011-03-10 NOTE — Progress Notes (Signed)
HISTORY OF PRESENT ILLNESS:  Hannah Parsons is a 55 y.o. female with hypertension, hypothyroidism, hyperlipidemia, diabetes mellitus, obesity, and adenomatous colon polyps. She presents today with a new complaint of dysphagia. She was seen on one other occasion, as a direct referral for screening colonoscopy on 09/03/2009. Multiple colon polyps found and removed. Followup in 3 years recommend. Her current history is that of choking episodes with liquids and intermittent solid food dysphagia items like bread for about one year. This has worsened in terms of frequency, over time. She does have rare reflux symptoms such as pyrosis. On no GERD meds. 10 pound weight gain over the past year. Note, pain. Problems with constipation which are managed with MiraLax. Chronic medical problems stable.  REVIEW OF SYSTEMS:  All non-GI ROS negative.  Past Medical History  Diagnosis Date  . Hypertension   . Hypothyroidism   . Hyperlipidemia   . Diabetes mellitus   . History of cholelithiasis   . History of nephrolithiasis   . History of colon polyps     hyperplastic    Past Surgical History  Procedure Date  . Dilation and curettage of uterus     Dr.Gaccione  . Appendectomy   . Ovarian cyst removal 1984  . Colonoscopy w/ polypectomy     2 adenmas & 1 hyperplastic  polyp, Due 2013. Dr.Perry  . Tear duct probing 10/2009    Social History Hannah Parsons  reports that she has never smoked. She has never used smokeless tobacco. She reports that she drinks alcohol. She reports that she does not use illicit drugs.  family history includes Colon cancer (age of onset:90) in her maternal grandfather; Esophageal cancer in her maternal grandmother; Hypertension in an unspecified family member; Lymphoma in her mother; and Lymphoma (age of onset:82) in her maternal grandfather.  Allergies  Allergen Reactions  . Codeine Sulfate     REACTION: hives  . Erythromycin Base     REACTION: stomach cramps  . Losartan  Potassium     REACTION: PATIENT DIDNT FEEL WELL WHILE TAKING; intercostal cp, fatigue  . Meperidine Hcl     REACTION: vomiting       PHYSICAL EXAMINATION: Vital signs: BP 128/90  Pulse 80  Ht 5\' 6"  (1.676 m)  Wt 272 lb (123.378 kg)  BMI 43.90 kg/m2  Constitutional: generally well-appearing but obese, no acute distress Psychiatric: alert and oriented x3, cooperative Eyes: extraocular movements intact, anicteric, conjunctiva pink Mouth: oral pharynx moist, no lesions Neck: supple no lymphadenopathy Cardiovascular: heart regular rate and rhythm, no murmur Lungs: clear to auscultation bilaterally Abdomen: soft, nontender, nondistended, no obvious ascites, no peritoneal signs, normal bowel sounds, no organomegaly Extremities: no lower extremity edema bilaterally Skin: no lesions on visible extremities Neuro: No focal deficits.   ASSESSMENT:  #1. Intermittent somewhat progressive problems with liquid (choking) and solid food dysphagia. Rule out stricture or ring. Rule out dysmotility. #2. Diabetes mellitus #3. History of multiple adenomatous polyps on colonoscopy November 2010 #4. Constipation   PLAN:  #1. Upper endoscopy with possible esophageal dilation.The nature of the procedure, as well as the risks, benefits, and alternatives were carefully and thoroughly reviewed with the patient. Ample time for discussion and questions allowed. The patient understood, was satisfied, and agreed to proceed.  #2. Patient takes diabetic medication at night. No adjustment needed #3. If endoscopy is unrevealing, then consider swallowing evaluation or motility study #4. Surveillance colonoscopy due to a round of November 2013. Patient aware #5. Continue MiraLax when necessary

## 2011-03-10 NOTE — Patient Instructions (Signed)
EGD scheduled for 03/25/11 2:00 pm arrive at 1:00 pm  Endoscopy brochure given for you to review. Please do not take your diabetic medication until after procedure.

## 2011-03-16 ENCOUNTER — Other Ambulatory Visit: Payer: Self-pay | Admitting: Internal Medicine

## 2011-03-17 NOTE — Telephone Encounter (Signed)
244.9 TSH 

## 2011-03-25 ENCOUNTER — Other Ambulatory Visit: Payer: 59 | Admitting: Internal Medicine

## 2011-04-01 ENCOUNTER — Telehealth: Payer: Self-pay | Admitting: Internal Medicine

## 2011-04-01 NOTE — Telephone Encounter (Signed)
Sorry to hear that... No charge  

## 2011-04-02 ENCOUNTER — Other Ambulatory Visit: Payer: 59 | Admitting: Internal Medicine

## 2011-04-08 ENCOUNTER — Other Ambulatory Visit (INDEPENDENT_AMBULATORY_CARE_PROVIDER_SITE_OTHER): Payer: 59

## 2011-04-08 DIAGNOSIS — R1310 Dysphagia, unspecified: Secondary | ICD-10-CM

## 2011-04-08 NOTE — Progress Notes (Signed)
Labs only

## 2011-04-13 ENCOUNTER — Other Ambulatory Visit: Payer: Self-pay | Admitting: Internal Medicine

## 2011-04-22 ENCOUNTER — Other Ambulatory Visit: Payer: 59 | Admitting: Internal Medicine

## 2011-04-27 ENCOUNTER — Other Ambulatory Visit: Payer: Self-pay | Admitting: Internal Medicine

## 2011-04-27 DIAGNOSIS — Z1231 Encounter for screening mammogram for malignant neoplasm of breast: Secondary | ICD-10-CM

## 2011-04-28 ENCOUNTER — Encounter: Payer: Self-pay | Admitting: Internal Medicine

## 2011-05-03 HISTORY — PX: CHOLECYSTECTOMY, LAPAROSCOPIC: SHX56

## 2011-05-04 ENCOUNTER — Other Ambulatory Visit: Payer: Self-pay | Admitting: Internal Medicine

## 2011-05-05 ENCOUNTER — Ambulatory Visit
Admission: RE | Admit: 2011-05-05 | Discharge: 2011-05-05 | Disposition: A | Discharge status: Home / Self Care | Payer: 59 | Source: Ambulatory Visit | Attending: Internal Medicine | Admitting: Internal Medicine

## 2011-05-05 DIAGNOSIS — Z1231 Encounter for screening mammogram for malignant neoplasm of breast: Secondary | ICD-10-CM

## 2011-05-11 ENCOUNTER — Ambulatory Visit (INDEPENDENT_AMBULATORY_CARE_PROVIDER_SITE_OTHER): Payer: 59 | Admitting: Internal Medicine

## 2011-05-11 ENCOUNTER — Encounter: Payer: Self-pay | Admitting: Internal Medicine

## 2011-05-11 DIAGNOSIS — E8881 Metabolic syndrome: Secondary | ICD-10-CM

## 2011-05-11 DIAGNOSIS — I10 Essential (primary) hypertension: Secondary | ICD-10-CM

## 2011-05-11 DIAGNOSIS — E039 Hypothyroidism, unspecified: Secondary | ICD-10-CM

## 2011-05-11 DIAGNOSIS — E785 Hyperlipidemia, unspecified: Secondary | ICD-10-CM

## 2011-05-11 MED ORDER — LEVOTHYROXINE SODIUM 25 MCG PO TABS: 25.0000 ug | ORAL_TABLET | Freq: Every day | ORAL | Status: DC

## 2011-05-11 MED ORDER — LEVOTHYROXINE SODIUM 150 MCG PO TABS: 150.0000 ug | ORAL_TABLET | Freq: Every day | ORAL | Status: DC

## 2011-05-11 MED ORDER — METFORMIN HCL ER 500 MG PO TB24: 500.0000 mg | ORAL_TABLET | Freq: Two times a day (BID) | ORAL | Status: DC

## 2011-05-11 MED ORDER — PRAVASTATIN SODIUM 20 MG PO TABS: 20.0000 mg | ORAL_TABLET | Freq: Every evening | ORAL | Status: DC

## 2011-05-11 NOTE — Patient Instructions (Signed)
Preventive Health Care: Exercise  30-45  minutes a day, 3-4 days a week. Walking is especially valuable in preventing Osteoporosis. Eat a low-fat diet with lots of fruits and vegetables, up to 7-9 servings per day. Avoid obesity; your goal = waist less than 35 inches.Consume less than 30 grams of sugar per day from foods & drinks with High Fructose Corn Syrup as #1, 2,3 or #4 on label. Please  schedule fasting Labs : Lipids, hepatic panel, CK,A1c, insulin level in 10 weeks .

## 2011-05-11 NOTE — Progress Notes (Signed)
Subjective:    Patient ID: Hannah Parsons, female    DOB: Sep 20, 1956, 55 y.o.   MRN: [id number]  HPI  #1 Dyslipidemia assessment: Prior Advanced Lipid Testing: NMR Lipoprofile .   Family history of premature CAD/ MI: no .  Nutrition: not specific but low sugar .  Exercise: walking daily up to 1/2 mpd . Diabetes : A1c 6.1 % on 6/15 but insulin level 30. Marland Kitchen HTN: controlled @ home, average 122/78. Smoking history  : never .   Weight :  stable. ROS: fatigue: no ;  abd pain/bowel changes: no ; myalgias:no(CK 87);  syncope : no ; memory loss: no;skin changes: no. Lab results reviewed :[place] Heart panel risks: LDL 127, HDL < 50. Options discussed.  #2Diabetes status assessment: Fasting or morning glucose range:  110-125 or average :  118  . Highest glucose 2 hours after any meal:  < 140. Hypoglycemia :  no .                                                     Excess thirst :no;  Excess hunger:  no ;  Excess urination:  no.                                  Lightheadedness with standing:  no. Chest pain:  no ; Palpitations :non exertional intermittently ;  Pain in  calves with walking:  no .                                                                                                                                 Non healing skin  ulcers or sores,especially over the feet:  no. Numbness or tingling or burning in feet : no .                                                                                                                                              Vision changes : no; no retinopathy  .  Medication compliance : yes. Medication adverse  Effects:  no . Eye exam : last week. Foot care : 01-01/2011 for arthritic changes   #3 Hypothyroidism : TSH 1.19 on 06/15     Review of Systems     Objective:   Physical Exam Gen.: Healthy and well-nourished in appearance. Alert, appropriate and cooperative throughout exam.  Central  weight excess Eyes: No corneal or conjunctival inflammation noted.  Neck: No deformities, masses, or tenderness noted. Thyroid  normal. Lungs: Normal respiratory effort; chest expands symmetrically. Lungs are clear to auscultation without rales, wheezes, or increased work of breathing. Heart: Normal rate and rhythm. Normal S1 and S2. No gallop, click, or rub. No murmur. Abdomen: Bowel sounds normal; abdomen soft and nontender. No masses, organomegaly or hernias noted.                                                                         Musculoskeletal/extremities: No clubbing, cyanosis, edema, or deformity noted. Joints normal. Nail health  good. Vascular: Carotid, radial artery, dorsalis pedis and  posterior tibial pulses are full and equal. No bruits present. Neurologic: Alert and oriented x3. Deep tendon reflexes symmetrical and normal.Light touch normal over feet.          Skin: Intact without suspicious lesions or rashes. Psych: Mood and affect are normal. Normally interactive                                                                                         Assessment & Plan:  #1 see Problem List with Assessments & Recommendations Plan: see Orders

## 2011-05-21 ENCOUNTER — Encounter (INDEPENDENT_AMBULATORY_CARE_PROVIDER_SITE_OTHER): Payer: Self-pay | Admitting: General Surgery

## 2011-05-21 ENCOUNTER — Other Ambulatory Visit (INDEPENDENT_AMBULATORY_CARE_PROVIDER_SITE_OTHER): Payer: Self-pay | Admitting: General Surgery

## 2011-05-21 ENCOUNTER — Ambulatory Visit (INDEPENDENT_AMBULATORY_CARE_PROVIDER_SITE_OTHER): Payer: 59 | Admitting: General Surgery

## 2011-05-21 VITALS — BP 158/112 | HR 72 | Temp 96.0°F | Ht 66.5 in | Wt 273.4 lb

## 2011-05-21 DIAGNOSIS — K819 Cholecystitis, unspecified: Secondary | ICD-10-CM

## 2011-05-21 DIAGNOSIS — K810 Acute cholecystitis: Secondary | ICD-10-CM

## 2011-05-21 NOTE — Progress Notes (Signed)
Subjective:     Patient ID: Hannah Parsons, female   DOB: 08/22/1956, 55 y.o.   MRN: [id number]  HPI This is a very pleasant 55 year old Caucasian female who works for [employer] [place] [employer], referred by Dr. Marga Melnick for consideration of cholecystectomy.  The patient was worked up for back pain and possible kidney stones back in 2009. A CT scan showed gallstones at that time. At that time she was not having any gallbladder symptoms. She was made aware of her gallstones, however.  She now gives a six-month history of intermittent episodes of right upper quadrant pain which would occur about once a week and last for about 6 hours. For the last 10 days she has been having daily right upper quadrant pain and nausea. The pain will wax and wane. She has not vomited or had any documented fever. She feels like the symptoms have been worsening over the past week. She had some Cipro and omeprazole at home and she began taking this about 8 days ago. She is feeling better today. She had one episode of diarrhea yesterday. Her appetite is diminished but she has been able to eat.  Lab work was done at [facility] this lab partners today and showed a normal CBC, a normal Complete metabolic panel except for glucose of 156, normal amylase and normal lipase.  She states that she would like to proceed with cholecystectomy as soon as possible, if we agree.  Past Medical History  Diagnosis Date  . Hypertension   . Hypothyroidism   . Hyperlipidemia   . Diabetes mellitus   . History of cholelithiasis   . History of nephrolithiasis   . History of colon polyps     hyperplastic  . Pain     extreme    Current Outpatient Prescriptions  Medication Sig Dispense Refill  . Cholecalciferol (VITAMIN D3) 2000 UNITS TABS Take 2,000 Units by mouth daily.        Marland Kitchen glucose blood (ONE TOUCH ULTRA TEST) test strip 1 each by Other route. Check blood sugar once daily DX: 250.00       . levothyroxine (LEVOXYL) 150 MCG tablet Take 1 tablet  (150 mcg total) by mouth daily.  90 tablet  1  . levothyroxine (LEVOXYL) 25 MCG tablet Take 1 tablet (25 mcg total) by mouth daily at 2 PM daily at 2 PM. 1/2 daily  90 tablet  0  . Melatonin 5 MG TABS Take 5 mg by mouth at bedtime.        . metFORMIN (GLUCOPHAGE-XR) 500 MG 24 hr tablet Take 1 tablet (500 mg total) by mouth 2 (two) times daily. 1 twice a day with 2 largest meals  180 tablet  1  . ONETOUCH DELICA LANCETS MISC by Does not apply route. Check blood sugar daily DX:250.00       . Polyethylene Glycol 3350 (MIRALAX PO) Take by mouth.        . pravastatin (PRAVACHOL) 20 MG tablet Take 1 tablet (20 mg total) by mouth every evening.  90 tablet  0    Allergies  Allergen Reactions  . Codeine Sulfate     REACTION: hives  . Erythromycin Base     REACTION: stomach cramps  . Losartan Potassium     REACTION: PATIENT DIDNT FEEL WELL WHILE TAKING; intercostal cp, fatigue  . Meperidine Hcl     REACTION: vomiting    Family History  Problem Relation Age of Onset  . Hypertension    . Lymphoma Mother   .  Lymphoma Maternal Grandfather 52  . Colon cancer Maternal Grandfather 90  . Esophageal cancer Maternal Grandmother     History  Substance Use Topics  . Smoking status: Never Smoker   . Smokeless tobacco: Never Used  . Alcohol Use: Yes     Rare      Review of Systems  Constitutional: Positive for chills and appetite change. Negative for fever, diaphoresis, activity change, fatigue and unexpected weight change.  HENT: Negative.   Eyes: Negative.   Respiratory: Negative.   Cardiovascular: Negative.   Gastrointestinal: Positive for nausea, abdominal pain and diarrhea. Negative for vomiting, constipation, blood in stool, abdominal distention, anal bleeding and rectal pain.  Genitourinary: Negative.   Musculoskeletal: Positive for back pain. Negative for myalgias, joint swelling, arthralgias and gait problem.  Skin: Negative.   Neurological: Negative.   Hematological: Negative.     Psychiatric/Behavioral: Negative.        Objective:   Physical Exam  Constitutional: She is oriented to person, place, and time. She appears well-developed and well-nourished. No distress.  HENT:  Head: Normocephalic and atraumatic.  Mouth/Throat: No oropharyngeal exudate.  Eyes: Conjunctivae are normal. Pupils are equal, round, and reactive to light. No scleral icterus.  Neck: Normal range of motion. Neck supple. No JVD present. No tracheal deviation present. No thyromegaly present.  Cardiovascular: Normal rate, regular rhythm and normal heart sounds.  Exam reveals no friction rub.   No murmur heard. Pulmonary/Chest: Effort normal and breath sounds normal. No respiratory distress. She has no wheezes. She has no rales. She exhibits no tenderness.  Abdominal: Soft. Bowel sounds are normal. She exhibits no distension and no mass. There is tenderness. There is no rebound and no guarding.    Musculoskeletal: Normal range of motion. She exhibits no edema and no tenderness.  Lymphadenopathy:    She has no cervical adenopathy.  Neurological: She is alert and oriented to person, place, and time. She exhibits normal muscle tone.  Skin: Skin is warm and dry. No rash noted. She is not diaphoretic. No erythema. No pallor.  Psychiatric: She has a normal mood and affect. Her behavior is normal. Thought content normal.       Assessment:     Chronic cholecystitis with cholelithiasis. She is either having accelerating biliary colic or subacute cholecystitis at this time.  Obesity.  Hypertension.  Diabetes mellitus, non-insulin-dependent.  Hypothyroidism.  Status post appendectomy and ovarian cyst removal. Remote.   Plan:     We will need to proceed with laparoscopic cholecystectomy with cholangiogram in the near future. We are attempting to schedule this on Monday, July 23.  In the interim she is to stay hydrated, adhere to a very low-fat diet, and I have given her a prescription for  Vicodin, 30 tablets.  She is instructed to call us in the interim if she develops increasing pain, vomiting, or fever. She is aware that if she becomes acutely ill the surgery may have to be done sooner.  I have discussed the indications and details of surgery with her and her husband. Risks and consultations have been outlined, including but not limited to bleeding, infection, conversion to open laparotomy, bile leak, wound healing problems, injury to adjacent organs such as the main bile duct or intestine with major reconstructive surgery, cardiac, pulmonary, and thromboembolic problems. She seems to understand these issues well. At this time all of her questions are answered. She is infiltrated with this plan.

## 2011-05-21 NOTE — Patient Instructions (Signed)
You will be scheduled for a laparoscopic cholecystectomy and possible cholangiogram this coming Monday afternoon, July 23.  In the meantime I have given you a prescription for Vicodin, 30 tablets, to help with any discomfort that may arise over the weekend. If you develop fever, chills, vomiting, or worsening abdominal pain he should call our office day or night. If your symptoms worsen over the weekend we may have to do the surgery sooner. Please stay hydrated and  adhere to a very low-fat diet over the weekend.

## 2011-05-25 ENCOUNTER — Ambulatory Visit (HOSPITAL_COMMUNITY)
Admission: RE | Admit: 2011-05-25 | Discharge: 2011-05-26 | Disposition: A | Discharge status: Home / Self Care | Payer: 59 | Source: Ambulatory Visit | Attending: General Surgery | Admitting: General Surgery

## 2011-05-25 ENCOUNTER — Ambulatory Visit (HOSPITAL_COMMUNITY): Payer: 59

## 2011-05-25 ENCOUNTER — Other Ambulatory Visit (INDEPENDENT_AMBULATORY_CARE_PROVIDER_SITE_OTHER): Payer: Self-pay | Admitting: General Surgery

## 2011-05-25 DIAGNOSIS — E119 Type 2 diabetes mellitus without complications: Secondary | ICD-10-CM | POA: Insufficient documentation

## 2011-05-25 DIAGNOSIS — K8010 Calculus of gallbladder with chronic cholecystitis without obstruction: Secondary | ICD-10-CM

## 2011-05-25 DIAGNOSIS — I10 Essential (primary) hypertension: Secondary | ICD-10-CM | POA: Insufficient documentation

## 2011-05-25 DIAGNOSIS — Z01812 Encounter for preprocedural laboratory examination: Secondary | ICD-10-CM | POA: Insufficient documentation

## 2011-05-25 DIAGNOSIS — Z01818 Encounter for other preprocedural examination: Secondary | ICD-10-CM | POA: Insufficient documentation

## 2011-05-25 DIAGNOSIS — K8000 Calculus of gallbladder with acute cholecystitis without obstruction: Secondary | ICD-10-CM | POA: Insufficient documentation

## 2011-05-25 LAB — GLUCOSE, CAPILLARY
Glucose-Capillary: 116 mg/dL — ABNORMAL HIGH (ref 70–99)
Glucose-Capillary: 101 mg/dL — ABNORMAL HIGH (ref 70–99)
Glucose-Capillary: 157 mg/dL — ABNORMAL HIGH (ref 70–99)
Glucose-Capillary: 105 mg/dL — ABNORMAL HIGH (ref 70–99)
Glucose-Capillary: 142 mg/dL — ABNORMAL HIGH (ref 70–99)

## 2011-05-25 LAB — SURGICAL PCR SCREEN
MRSA, PCR: NEGATIVE
Staphylococcus aureus: NEGATIVE

## 2011-05-26 LAB — GLUCOSE, CAPILLARY: Glucose-Capillary: 114 mg/dL — ABNORMAL HIGH (ref 70–99)

## 2011-05-26 NOTE — Op Note (Signed)
NAMEALYSABETH, SCALIA                 ACCOUNT NO.:  [id number]  MEDICAL RECORD NO.:  [id number]  LOCATION:  5150                         FACILITY:  [facility]  PHYSICIAN:  Angelia Mould. Derrell Lolling, M.D.DATE OF BIRTH:  [birth year]  DATE OF PROCEDURE:  05/25/2011 DATE OF DISCHARGE:                              OPERATIVE REPORT   PREOPERATIVE DIAGNOSIS:  Chronic cholecystitis with cholelithiasis  POSTOPERATIVE DIAGNOSIS:  Subacute cholecystitis with cholelithiasis  OPERATION PERFORMED:  Laparoscopic cholecystectomy with intraoperative cholangiogram.  SURGEON:  Angelia Mould. Derrell Lolling, MD  FIRST ASSISTANT:  Lorne Skeens. Hoxworth, MD  OPERATIVE INDICATIONS:  This is a 55 year old Caucasian female who has documented gallstones.  She has diabetes and obesity and hypertension as well.  Her health has been stable until the past 6 months and then she began having intermittent episodes of right upper quadrant pain weekly. For the past 2 weeks, she has been having daily right upper quadrant pain and nausea.  She has been evaluated by her primary care physician. She was placed on Cipro and omeprazole.  Lab work was done last week which showed a normal CBC, normal liver function test, and normal amylase and lipase.  I evaluated her in the office late last week and her abdomen was soft, but a little bit tender in the right upper quadrant.  We expedited scheduling of her cholecystectomy.  OPERATIVE FINDINGS:  The gallbladder was edematous and appeared to be a little bit acutely inflamed, but there was no exudate.  No gangrene. The bile within the gallbladder was green and was not purulent.  The liver looked healthy.  The stomach, duodenum, small intestine, large intestine were grossly normal, although her very large omentum obscured visualization of the intestinal viscera for the most part. Intraoperative cholangiogram showed normal intrahepatic and extrahepatic bile ducts, no filling defect, and no obstruction  with good flow of contrast into the duodenum.  The bile duct caliber was small.  OPERATIVE TECHNIQUE:  Following the induction of general endotracheal anesthesia, the patient's abdomen was prepped and draped in sterile fashion.  Intravenous antibiotics were given.  The patient was identified as correct patient and correct procedure.  Marcaine 0.5% with epinephrine was used as a local infiltration anesthetic.  A vertically oriented incision was made in the midline above the umbilicus.  The fascia was incised in the midline.  The abdominal cavity entered under direct vision.  An 11-mm Hasson trocar was inserted and secured with pursestring suture of 0 Vicryl.  Pneumoperitoneum was created.  Video cam was inserted with visualization and findings as described above.  An 11-mm trocars placed in the subxiphoid region and two 5-mm trocars were placed in the right upper quadrant.  The gallbladder fundus was grasped. It was quite tense and so I used a suction trocar to evacuate the bile from the gallbladder.  This made it a little bit easier to hold onto the gallbladder.  We were then able to look down and identify the infundibulum and pull that up.  We incised the peritoneum around the lower into the infundibulum and slowly dissected out the cystic duct and the cystic artery.  A cholangiogram catheter was inserted into the cystic duct and  a cholangiogram was obtained using the C-arm.  The cholangiogram was normal as described above.  We then were comfortable with the anatomy and secured the cystic duct with multiple metal clips and divided.  The cystic artery actually consisted of an anterior branch and posterior branch which were separately controlled metal clips and divided.  We then able to dissect the gallbladder from its bed with electrocautery, placed in a specimen bag and removed it.  The operative field and the gallbladder bed were hemostatic.  We had spilled a little bit of bile from  the puncture site in the fundus and so we irrigated the subphrenic space and subphrenic spaces quite copiously until all the irrigation fluid was completely clear.  We went back and checked the areas of the cystic duct stump and the cystic artery stump, and they looked perfectly clean without any leakage.  After checking one more time for residual fluid, the trocars were removed under direct vision. There was no bleeding from trocar sites.  The pneumoperitoneum was released.  The fascia at the umbilicus was closed with 0 Vicryl sutures. The skin incisions were closed with subcuticular sutures of 4-0 Monocryl and Dermabond.  The patient tolerated the well and was taken recovery room in excellent condition.  Estimated blood loss was about 10-20 mL, or less, sponge, needle and counts were correct.     Angelia Mould. Derrell Lolling, M.D.     HMI/MEDQ  D:  05/25/2011  T:  05/26/2011  Job:  [id number]  Electronically Signed by Claud Kelp M.D. on 05/26/2011 07:25:29 AM

## 2011-05-27 ENCOUNTER — Encounter (INDEPENDENT_AMBULATORY_CARE_PROVIDER_SITE_OTHER): Payer: 59 | Admitting: Surgery

## 2011-06-05 ENCOUNTER — Encounter: Payer: Self-pay | Admitting: Internal Medicine

## 2011-06-05 ENCOUNTER — Ambulatory Visit (INDEPENDENT_AMBULATORY_CARE_PROVIDER_SITE_OTHER): Payer: 59 | Admitting: Internal Medicine

## 2011-06-05 VITALS — BP 148/90 | HR 77 | Temp 98.6°F | Wt 273.4 lb

## 2011-06-05 DIAGNOSIS — I10 Essential (primary) hypertension: Secondary | ICD-10-CM

## 2011-06-05 DIAGNOSIS — E119 Type 2 diabetes mellitus without complications: Secondary | ICD-10-CM

## 2011-06-05 DIAGNOSIS — E785 Hyperlipidemia, unspecified: Secondary | ICD-10-CM

## 2011-06-05 MED ORDER — CARVEDILOL 6.25 MG PO TABS: 6.2500 mg | ORAL_TABLET | ORAL | Status: DC

## 2011-06-05 NOTE — Patient Instructions (Signed)
Blood Pressure Goal  Ideally is an AVERAGE < 135/85. This AVERAGE should be calculated from @ least 5-7 BP readings taken @ different times of day on different days of week. You should not respond to isolated BP readings , but rather the AVERAGE for that week  

## 2011-06-05 NOTE — Progress Notes (Signed)
  Subjective:    Patient ID: Archie Endo Page, female    DOB: [birth year], 55 y.o.   MRN: [id number]  HPI HYPERTENSION:recent BP elevation since GB issues (S/P cholecystectomy 05/25/2011) Disease Monitoring  Blood pressure range: 150/100- 170/110  Chest pain: no   Dyspnea: no   Claudication: no   Medication compliance: off meds X 12 months   Lightheadedness: no  Urinary frequency: no  Edema: no  Preventitive Healthcare:  Exercise: yes, walking 1 mpd   Diet Pattern: no HFCS, low glycemic carb   Salt Restriction: yes      Review of Systems she has stopped the statin; she questioned whether that was causing some of the symptoms related to the gallbladder disease. She was having headaches which have resolved off the statin  Fasting blood sugars have been averaging 110     Objective:   Physical Exam Gen.: Healthy and well-nourished in appearance. Alert, appropriate and cooperative throughout exam. Lungs: Normal respiratory effort; chest expands symmetrically. Lungs are clear to auscultation without rales, wheezes, or increased work of breathing. Heart: Normal rate and rhythm. Normal S1 and S2. No gallop, click, or rub. S4 with slurring; no  murmur. Abdomen: Bowel sounds normal; abdomen soft and nontender. No masses, organomegaly or hernias noted. No AAA or bruits                                                                             Musculoskeletal/extremities:  No clubbing, cyanosis, edema, or deformity noted.  Vascular: Carotid, radial artery, dorsalis pedis and  posterior tibial pulses are full and equal. No bruits present. Neurologic: Alert and oriented x3.  Skin: Intact without suspicious lesions or rashes. Wounds well healed Psych: Mood and affect are normal. Normally interactive                                                                                         Assessment & Plan:   #1 hypertension; subjective intolerance to postpartum  #2 diabetes well  controlled by home records  #3 dyslipidemia; she has questions about possible adverse effects of pravastatin  ( fatigue & headache)

## 2011-06-09 ENCOUNTER — Encounter: Payer: Self-pay | Admitting: Internal Medicine

## 2011-06-12 ENCOUNTER — Encounter (INDEPENDENT_AMBULATORY_CARE_PROVIDER_SITE_OTHER): Payer: Self-pay | Admitting: General Surgery

## 2011-06-12 ENCOUNTER — Ambulatory Visit (INDEPENDENT_AMBULATORY_CARE_PROVIDER_SITE_OTHER): Payer: 59 | Admitting: General Surgery

## 2011-06-12 VITALS — BP 138/76 | HR 72 | Temp 96.8°F

## 2011-06-12 DIAGNOSIS — Z9889 Other specified postprocedural states: Secondary | ICD-10-CM

## 2011-06-12 NOTE — Patient Instructions (Signed)
You. seem to have recovered well from your gallbladder surgery. I advised a low-fat diet and advise you to stay well hydrated. You may resume all normal physical activities without restriction. Return to see me as needed.

## 2011-06-12 NOTE — Progress Notes (Signed)
Subjective:     Patient ID: Hannah Parsons, female   DOB: 1956-06-25, 55 y.o.   MRN: [id number]  HPI This 55 year old Caucasian female underwent laparoscopic cholecystectomy with cholangiogram on May 26, 2011. The surgery was uneventful. She is recovering uneventfully. She has resumed her diet. She has no wound problems. She has no fever. She has no diarrhea. She has returned to work full-time. Final pathology report shows chronic cholecystitis with gallstones.  Review of Systems     Objective:   Physical Exam The patient looks well. She is in good spirits. Abdomen is soft and nontender. The wounds appear to be healing without any signs of infection. There was a small Monocryl suture at the epigastric port which I removed.    Assessment:     Chronic cholecystitis with cholelithiasis, recovering uneventfully following laparoscopic cholecystectomy with cholangiogram.    Plan:     Okay to resume all normal physical activities without restriction.  Low-fat diet.  Return to see me p.r.n.

## 2011-06-25 ENCOUNTER — Ambulatory Visit (INDEPENDENT_AMBULATORY_CARE_PROVIDER_SITE_OTHER): Payer: 59 | Admitting: Internal Medicine

## 2011-06-25 ENCOUNTER — Encounter: Payer: Self-pay | Admitting: Internal Medicine

## 2011-06-25 VITALS — BP 148/100 | HR 71 | Temp 98.3°F | Wt 269.4 lb

## 2011-06-25 DIAGNOSIS — I10 Essential (primary) hypertension: Secondary | ICD-10-CM

## 2011-06-25 MED ORDER — CARVEDILOL 12.5 MG PO TABS: 12.5000 mg | ORAL_TABLET | ORAL | Status: DC

## 2011-06-25 MED ORDER — RAMIPRIL 5 MG PO CAPS: 5.0000 mg | ORAL_CAPSULE | ORAL | Status: DC

## 2011-06-25 NOTE — Progress Notes (Signed)
  Subjective:    Patient ID: Hannah Parsons, female    DOB: [birth year], 55 y.o.   MRN: [id number]  HPI CHRONIC HYPERTENSION: no response to date with Carvedilol titration Disease Monitoring  Blood pressure range: 160/98-108 on average; Pulse 61 on average  Chest pain: no   Dyspnea: no   Claudication: no  Medication compliance: yes,   Medication Side Effects  Lightheadedness: no   Urinary frequency: no   Edema: no  Preventitive Healthcare:  Exercise: yes, walking on treadmill 30 min with incline of 10 degrees   Diet Pattern: low glycemic carb program  Salt Restriction: no              Ophth exam : 3 weeks ago; no retinopathy or hypertensive changes      Review of Systems     Objective:   Physical Exam Gen.: in no distress. Alert, appropriate and cooperative throughout exam. Eyes: No corneal or conjunctival inflammation noted. Fundal exam is benign without hemorrhages, exudate, papilledema.  Lungs: Normal respiratory effort; chest expands symmetrically. Lungs are clear to auscultation without rales, wheezes, or increased work of breathing. Heart: Normal rate and rhythm. Normal S1 and S2. No gallop, click, or rub. No  murmur. Abdomen: Bowel sounds normal; abdomen soft and nontender. No masses, organomegaly or hernias noted. No AAA or renal artery bruits                                                                                 Musculoskeletal/extremities: No clubbing, cyanosis, edema  noted. Nail health  good. Vascular: Carotid, radial artery, dorsalis pedis and  posterior tibial pulses are full and equal. No bruits present. Neurologic: Alert and oriented x3.   Skin: Intact without suspicious lesions or rashes. Psych: Mood and affect are normal. Normally interactive                                                                                         Assessment & Plan:  #1 hypertension, suboptimal control on low-dose carvedilol  Plan: add  ACE inhibitor. Titrate  carvedilol if needed.

## 2011-06-25 NOTE — Patient Instructions (Signed)
Blood Pressure Goal  Ideally is an AVERAGE < 135/85. This AVERAGE should be calculated from @ least 5-7 BP readings taken @ different times of day on different days of week. You should not respond to isolated BP readings , but rather the AVERAGE for that week   After 7-10 days report your blood pressure average:Ramipril 5 mg,one  half a pill twice a day and carvedilol 12.5 mg, one half pill twice a day.

## 2011-06-26 ENCOUNTER — Telehealth: Payer: Self-pay | Admitting: Internal Medicine

## 2011-06-26 NOTE — Telephone Encounter (Signed)
Pt called says that ramipril 5 mg was capsule for and couldn't be cut in half. Pt needs new script for 2.5 mg since this comes in tablets called pharmacy to call in 2.5 mg tabs since pt is going out of town today.

## 2011-07-20 ENCOUNTER — Ambulatory Visit: Payer: 59 | Admitting: Internal Medicine

## 2011-07-22 ENCOUNTER — Encounter: Payer: Self-pay | Admitting: Internal Medicine

## 2011-07-22 ENCOUNTER — Ambulatory Visit (INDEPENDENT_AMBULATORY_CARE_PROVIDER_SITE_OTHER): Payer: 59 | Admitting: Internal Medicine

## 2011-07-22 DIAGNOSIS — E8881 Metabolic syndrome: Secondary | ICD-10-CM

## 2011-07-22 DIAGNOSIS — I10 Essential (primary) hypertension: Secondary | ICD-10-CM

## 2011-07-22 DIAGNOSIS — E785 Hyperlipidemia, unspecified: Secondary | ICD-10-CM

## 2011-07-22 DIAGNOSIS — E039 Hypothyroidism, unspecified: Secondary | ICD-10-CM

## 2011-07-22 DIAGNOSIS — E119 Type 2 diabetes mellitus without complications: Secondary | ICD-10-CM

## 2011-07-22 MED ORDER — CARVEDILOL 6.25 MG PO TABS: 6.2500 mg | ORAL_TABLET | Freq: Two times a day (BID) | ORAL | Status: DC

## 2011-07-22 MED ORDER — RAMIPRIL 5 MG PO CAPS: 5.0000 mg | ORAL_CAPSULE | Freq: Every day | ORAL | Status: DC

## 2011-07-22 MED ORDER — METFORMIN HCL ER 500 MG PO TB24: 500.0000 mg | ORAL_TABLET | Freq: Every day | ORAL | Status: DC

## 2011-07-22 NOTE — Progress Notes (Signed)
  Subjective:    Patient ID: Hannah Parsons, female    DOB: 1956-01-05, 55 y.o.   MRN: [id number]  HPI #1 HYPERTENSION: Disease Monitoring: Blood pressure range-BP averages 130/80; "I had an immediate response to Ramipril " Chest pain, palpitations- no (she had chest pain with ARB)       Dyspnea- no Medications: Compliance- yes , but some difficulty remembering 2nd dose of Ramipril  Lightheadedness,Syncope- no    Edema- no  #2 DIABETES:A1c 6.1%, insulin level 17 (prev 30) Disease Monitoring: Blood Sugar ranges-FBS average < 115  Polyuria/phagia/dipsia- no       Visual problems- no; last Ophth < 2 mos ago, no retinopathy Medications: Compliance- yes  Hypoglycemic symptoms- no  #3HYPERLIPIDEMIA:TG 213,HDL 37,VLDL 43, LDL 95; CRP 0.86(< 0.6) Disease Monitoring: See symptoms for Hypertension Medications: Compliance- off Pravastatin 3-4 weeks  due to fatigue & headaches, migraine headaches  Abd pain, bowel changes- no   Muscle aches- no  [facility] labs reviewed: TSH 1.042   ROS See HPI above   PMH Smoking Status noted        Review of Systems ear pain R canal from molded ear plugs     Objective:   Physical Exam Gen.: well-nourished, appropriate and alert, waist > 35 inches Eyes: No lid/conjunctival changes, extraocular motion intact, fundi not checked (see Ophth above) Neck: Normal range of motion, thyroid normal Ear: negative canals & TMs Respiratory: No increased work of breathing or abnormal breath sounds Cardiac : regular rhythm, no extra heart sounds, gallop, murmur. S4 Abdomen: No organomegaly ,masses, bruits or aortic enlargement Lymph: No lymphadenopathy of the neck or axilla Skin: No rashes, lesions, ulcers or ischemic changes Musculoskeletal: no nail changes; joints normal Vasc:All pulses intact, no bruits present Neuro: Normal deep tendon reflexes, alert & oriented, sensation over feet normal Psych: judgment and insight, mood and affect normal . Motivated  & intelligent        Assessment & Plan:  #1 DM , superb control #2 dyslipidemia; @ goal except for TG,VLDL, HDL  #3HTN controlled #4 otic canal irritation Plan: see Orders; goals reviewed

## 2011-07-22 NOTE — Patient Instructions (Signed)
Eat a low-fat diet with lots of fruits and vegetables, up to 7-9 servings per day. Avoid obesity; your goal is waist measurement < 40 inches.Consume less than 40 grams of sugar per day from foods & drinks with High Fructose Corn Sugar as #1,2,3 or # 4 on label. Follow the low carb nutrition program in [employer] as closely as possible to prevent Diabetes progression & complications. White carbohydrates (potatoes, rice, bread, and pasta) have a high spike of sugar and a high load of sugar. For example a  baked potato has a cup of sugar and a  french fry  2 teaspoons of sugar. Yams, wild  rice, whole grained bread &  wheat pasta have been much lower spike and load of  sugar. Portions should be the size of a deck of cards or your palm.  Please  schedule fasting Labs : BMET,Lipids, hepatic panel, A1c , urine microalbumin (250.00, 272.4, 995.20). Use Aloe gel 1-2X/ day as needed

## 2011-07-31 ENCOUNTER — Other Ambulatory Visit: Payer: Self-pay | Admitting: Obstetrics and Gynecology

## 2011-08-03 ENCOUNTER — Telehealth: Payer: Self-pay | Admitting: *Deleted

## 2011-08-03 NOTE — Telephone Encounter (Signed)
She is cleared for surgery as long as her blood pressure and sugars are at the goals established as per patient instructions @ each visit

## 2011-08-03 NOTE — Telephone Encounter (Signed)
Pt left VM that she is to have total hysterectomy done. Pt states that Dr Edward Jolly needs to know if Pt is cleared to have surgery or does she need to come in for OV for clearance. Please advise

## 2011-08-03 NOTE — Telephone Encounter (Signed)
Spoke with patient, patient asked if I could fax this phone note to Dr.Silva: [phone] (Fax). Patient states she is monitoring blood pressure and blood sugar and readings are at goal.   Dr.Hopper informed   Note faxed

## 2011-08-04 NOTE — Patient Instructions (Addendum)
   Your procedure is scheduled on:  Thursday, Oct. 11, 2012 at [facility] through the [facility] at:  12:30pm Pick up the phone at the desk and dial [phone] and inform us of your arrival  Please call this number if you have any problems the morning of surgery: [phone]  Remember: Do not eat food after midnight  Wed., Oct 10th Do not drink clear liquids after:  10:00am Thurs., Oct 11th Take these medicines the morning of surgery with a SIP OF WATER: per anesthesia instructions  Do not wear jewelry, make-up, or FINGER nail polish Do not wear lotions, powders, or perfumes.  You may wear deodorant. Do not shave 48 hours prior to surgery. Do not bring valuables to the hospital. Leave suitcase in the car. After Surgery it may be brought to your room. For patients being admitted to the hospital, checkout time is 11:00am the day of discharge.  Patients discharged on the day of surgery will not be allowed to drive home.   Name and phone number of your driver: husband Hannah Parsons  cell [phone]   Remember to use your hibiclens as instructed.Please shower with 1/2 bottle the evening before your surgery and the other 1/2 bottle the morning of surgery.

## 2011-08-06 ENCOUNTER — Other Ambulatory Visit: Payer: Self-pay

## 2011-08-06 ENCOUNTER — Encounter (HOSPITAL_COMMUNITY): Payer: Self-pay

## 2011-08-06 ENCOUNTER — Encounter (HOSPITAL_COMMUNITY)
Admission: RE | Admit: 2011-08-06 | Discharge: 2011-08-06 | Disposition: A | Discharge status: Home / Self Care | Payer: 59 | Source: Ambulatory Visit | Attending: Obstetrics and Gynecology | Admitting: Obstetrics and Gynecology

## 2011-08-06 HISTORY — DX: Diaphragmatic hernia without obstruction or gangrene: K44.9

## 2011-08-06 LAB — CBC
HCT: 40 % (ref 36.0–46.0)
MCH: 29.3 pg (ref 26.0–34.0)
MCV: 90.7 fL (ref 78.0–100.0)
RDW: 14.3 % (ref 11.5–15.5)
WBC: 6.5 10*3/uL (ref 4.0–10.5)

## 2011-08-06 LAB — SURGICAL PCR SCREEN: Staphylococcus aureus: NEGATIVE

## 2011-08-06 LAB — BASIC METABOLIC PANEL
BUN: 14 mg/dL (ref 6–23)
CO2: 29 mEq/L (ref 19–32)
Calcium: 9.8 mg/dL (ref 8.4–10.5)
Chloride: 99 mEq/L (ref 96–112)
Creatinine, Ser: 0.62 mg/dL (ref 0.50–1.10)
Glucose, Bld: 122 mg/dL — ABNORMAL HIGH (ref 70–99)

## 2011-08-06 NOTE — Pre-Procedure Instructions (Signed)
Reviewed patient's history with Dr Cristela Blue, EKG done- on chart, BMP and CBC drawn.  Patient instructed to take BP and Thyroid meds on the morning of surgery with small sip of water.  With hold metformin.  Patient verbalizes understanding.

## 2011-08-12 MED ORDER — DEXTROSE 5 % IV SOLN
2.0000 g | INTRAVENOUS | Status: AC
  Administered 2011-08-13: 2 g via INTRAVENOUS
  Filled 2011-08-12: qty 2
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
## 2011-08-13 ENCOUNTER — Other Ambulatory Visit: Payer: Self-pay | Admitting: Obstetrics and Gynecology

## 2011-08-13 ENCOUNTER — Encounter (HOSPITAL_COMMUNITY): Payer: Self-pay | Admitting: *Deleted

## 2011-08-13 ENCOUNTER — Encounter (HOSPITAL_COMMUNITY): Payer: Self-pay | Admitting: Anesthesiology

## 2011-08-13 ENCOUNTER — Ambulatory Visit (HOSPITAL_COMMUNITY)
Admission: RE | Admit: 2011-08-13 | Discharge: 2011-08-14 | Disposition: A | Discharge status: Home / Self Care | Payer: 59 | Source: Ambulatory Visit | Attending: Obstetrics and Gynecology | Admitting: Obstetrics and Gynecology

## 2011-08-13 ENCOUNTER — Encounter (HOSPITAL_COMMUNITY)
Admission: RE | Disposition: A | Discharge status: Home / Self Care | Payer: Self-pay | Source: Ambulatory Visit | Attending: Obstetrics and Gynecology

## 2011-08-13 ENCOUNTER — Ambulatory Visit (HOSPITAL_COMMUNITY): Payer: 59 | Admitting: Anesthesiology

## 2011-08-13 DIAGNOSIS — E119 Type 2 diabetes mellitus without complications: Secondary | ICD-10-CM | POA: Insufficient documentation

## 2011-08-13 DIAGNOSIS — Z01812 Encounter for preprocedural laboratory examination: Secondary | ICD-10-CM | POA: Insufficient documentation

## 2011-08-13 DIAGNOSIS — N840 Polyp of corpus uteri: Secondary | ICD-10-CM | POA: Insufficient documentation

## 2011-08-13 DIAGNOSIS — Z90710 Acquired absence of both cervix and uterus: Secondary | ICD-10-CM

## 2011-08-13 DIAGNOSIS — N938 Other specified abnormal uterine and vaginal bleeding: Secondary | ICD-10-CM | POA: Insufficient documentation

## 2011-08-13 DIAGNOSIS — D251 Intramural leiomyoma of uterus: Secondary | ICD-10-CM | POA: Insufficient documentation

## 2011-08-13 DIAGNOSIS — Z01818 Encounter for other preprocedural examination: Secondary | ICD-10-CM | POA: Insufficient documentation

## 2011-08-13 DIAGNOSIS — E039 Hypothyroidism, unspecified: Secondary | ICD-10-CM

## 2011-08-13 DIAGNOSIS — I10 Essential (primary) hypertension: Secondary | ICD-10-CM

## 2011-08-13 DIAGNOSIS — D250 Submucous leiomyoma of uterus: Secondary | ICD-10-CM | POA: Insufficient documentation

## 2011-08-13 DIAGNOSIS — N8500 Endometrial hyperplasia, unspecified: Secondary | ICD-10-CM | POA: Insufficient documentation

## 2011-08-13 DIAGNOSIS — N949 Unspecified condition associated with female genital organs and menstrual cycle: Secondary | ICD-10-CM | POA: Insufficient documentation

## 2011-08-13 HISTORY — PX: SALPINGOOPHORECTOMY: SHX82

## 2011-08-13 HISTORY — PX: LAPAROSCOPIC ASSISTED VAGINAL HYSTERECTOMY: SHX5398

## 2011-08-13 LAB — TYPE AND SCREEN: ABO/RH(D): O NEG

## 2011-08-13 LAB — GLUCOSE, CAPILLARY: Glucose-Capillary: 144 mg/dL — ABNORMAL HIGH (ref 70–99)

## 2011-08-13 LAB — ABO/RH: ABO/RH(D): O NEG

## 2011-08-13 LAB — PROTIME-INR: INR: 0.96 (ref 0.00–1.49)

## 2011-08-13 SURGERY — HYSTERECTOMY, VAGINAL, LAPAROSCOPY-ASSISTED
Anesthesia: General | Site: Abdomen | Wound class: Clean Contaminated

## 2011-08-13 MED ORDER — LACTATED RINGERS IV SOLN
INTRAVENOUS | Status: DC
  Administered 2011-08-13: 1000 mL via INTRAVENOUS
  Administered 2011-08-14: 04:00:00 via INTRAVENOUS

## 2011-08-13 MED ORDER — MENTHOL 3 MG MT LOZG: 1.0000 | LOZENGE | OROMUCOSAL | Status: DC | PRN

## 2011-08-13 MED ORDER — ROCURONIUM BROMIDE 100 MG/10ML IV SOLN
INTRAVENOUS | Status: DC | PRN
  Administered 2011-08-13: 30 mg via INTRAVENOUS
  Administered 2011-08-13: 15 mg via INTRAVENOUS

## 2011-08-13 MED ORDER — NALOXONE HCL 0.4 MG/ML IJ SOLN: 0.4000 mg | INTRAMUSCULAR | Status: DC | PRN

## 2011-08-13 MED ORDER — PROPOFOL 10 MG/ML IV EMUL
INTRAVENOUS | Status: DC | PRN
  Administered 2011-08-13: 160 mg via INTRAVENOUS

## 2011-08-13 MED ORDER — HYDROMORPHONE HCL 1 MG/ML IJ SOLN
INTRAMUSCULAR | Status: AC
  Filled 2011-08-13: qty 1

## 2011-08-13 MED ORDER — NEOSTIGMINE METHYLSULFATE 1 MG/ML IJ SOLN
INTRAMUSCULAR | Status: AC
  Filled 2011-08-13: qty 10

## 2011-08-13 MED ORDER — LIDOCAINE-EPINEPHRINE 1 %-1:100000 IJ SOLN
INTRAMUSCULAR | Status: DC | PRN
  Administered 2011-08-13: 10 mL

## 2011-08-13 MED ORDER — LIDOCAINE HCL (CARDIAC) 20 MG/ML IV SOLN
INTRAVENOUS | Status: DC | PRN
  Administered 2011-08-13: 50 mg via INTRAVENOUS

## 2011-08-13 MED ORDER — DEXAMETHASONE SODIUM PHOSPHATE 4 MG/ML IJ SOLN
INTRAMUSCULAR | Status: DC | PRN
  Administered 2011-08-13: 5 mg via INTRAVENOUS

## 2011-08-13 MED ORDER — OXYCODONE-ACETAMINOPHEN 5-325 MG PO TABS
1.0000 | ORAL_TABLET | ORAL | Status: DC | PRN
  Filled 2011-08-13: qty 1

## 2011-08-13 MED ORDER — GLYCOPYRROLATE 0.2 MG/ML IJ SOLN
INTRAMUSCULAR | Status: AC
  Filled 2011-08-13: qty 1

## 2011-08-13 MED ORDER — NEOSTIGMINE METHYLSULFATE 1 MG/ML IJ SOLN
INTRAMUSCULAR | Status: DC | PRN
  Administered 2011-08-13: 4 mg via INTRAVENOUS

## 2011-08-13 MED ORDER — PROPOFOL 10 MG/ML IV EMUL
INTRAVENOUS | Status: AC
  Filled 2011-08-13: qty 20

## 2011-08-13 MED ORDER — ONDANSETRON HCL 4 MG/2ML IJ SOLN: 4.0000 mg | Freq: Four times a day (QID) | INTRAMUSCULAR | Status: DC | PRN

## 2011-08-13 MED ORDER — ONDANSETRON HCL 4 MG/2ML IJ SOLN
INTRAMUSCULAR | Status: DC | PRN
  Administered 2011-08-13: 4 mg via INTRAVENOUS

## 2011-08-13 MED ORDER — ONDANSETRON HCL 4 MG PO TABS: 4.0000 mg | ORAL_TABLET | Freq: Four times a day (QID) | ORAL | Status: DC | PRN

## 2011-08-13 MED ORDER — LACTATED RINGERS IV SOLN
INTRAVENOUS | Status: DC
  Administered 2011-08-13: 13:00:00 via INTRAVENOUS

## 2011-08-13 MED ORDER — DOCUSATE SODIUM 100 MG PO CAPS
100.0000 mg | ORAL_CAPSULE | Freq: Every day | ORAL | Status: DC
  Administered 2011-08-14: 100 mg via ORAL
  Filled 2011-08-13: qty 1

## 2011-08-13 MED ORDER — HYDROMORPHONE HCL 1 MG/ML IJ SOLN
0.2500 mg | INTRAMUSCULAR | Status: DC | PRN
  Administered 2011-08-13: 0.5 mg via INTRAVENOUS

## 2011-08-13 MED ORDER — MORPHINE SULFATE (PF) 1 MG/ML IV SOLN
INTRAVENOUS | Status: DC
  Administered 2011-08-13: 25 mg via INTRAVENOUS
  Administered 2011-08-13: 7.5 mg via INTRAVENOUS
  Administered 2011-08-14: 25 mg via INTRAVENOUS
  Administered 2011-08-14: 1.5 mg via INTRAVENOUS
  Administered 2011-08-14: 4.5 mg via INTRAVENOUS
  Administered 2011-08-14: 11.34 mg via INTRAVENOUS
  Filled 2011-08-13: qty 25

## 2011-08-13 MED ORDER — ONDANSETRON HCL 4 MG/2ML IJ SOLN
INTRAMUSCULAR | Status: AC
  Filled 2011-08-13: qty 2

## 2011-08-13 MED ORDER — SIMETHICONE 80 MG PO CHEW: 80.0000 mg | CHEWABLE_TABLET | Freq: Four times a day (QID) | ORAL | Status: DC | PRN

## 2011-08-13 MED ORDER — INSULIN ASPART 100 UNIT/ML ~~LOC~~ SOLN
0.0000 [IU] | SUBCUTANEOUS | Status: DC
  Administered 2011-08-13: 2 [IU] via SUBCUTANEOUS
  Administered 2011-08-14: 3 [IU] via SUBCUTANEOUS
  Administered 2011-08-14: 2 [IU] via SUBCUTANEOUS
  Filled 2011-08-13: qty 3

## 2011-08-13 MED ORDER — LEVOTHYROXINE SODIUM 150 MCG PO TABS
150.0000 ug | ORAL_TABLET | Freq: Every day | ORAL | Status: DC
  Administered 2011-08-14: 150 ug via ORAL
  Filled 2011-08-13 (×2): qty 1

## 2011-08-13 MED ORDER — LACTATED RINGERS IR SOLN
Status: DC | PRN
  Administered 2011-08-13: 3000 mL

## 2011-08-13 MED ORDER — TEMAZEPAM 15 MG PO CAPS: 15.0000 mg | ORAL_CAPSULE | Freq: Every evening | ORAL | Status: DC | PRN

## 2011-08-13 MED ORDER — FENTANYL CITRATE 0.05 MG/ML IJ SOLN
INTRAMUSCULAR | Status: AC
  Filled 2011-08-13: qty 2

## 2011-08-13 MED ORDER — HYDROMORPHONE HCL 1 MG/ML IJ SOLN
INTRAMUSCULAR | Status: DC | PRN
  Administered 2011-08-13: 1 mg via INTRAVENOUS

## 2011-08-13 MED ORDER — FENTANYL CITRATE 0.05 MG/ML IJ SOLN
INTRAMUSCULAR | Status: DC | PRN
  Administered 2011-08-13 (×3): 50 ug via INTRAVENOUS

## 2011-08-13 MED ORDER — MIDAZOLAM HCL 5 MG/5ML IJ SOLN
INTRAMUSCULAR | Status: DC | PRN
  Administered 2011-08-13: 2 mg via INTRAVENOUS

## 2011-08-13 MED ORDER — ATROPINE SULFATE 0.4 MG/ML IJ SOLN
INTRAMUSCULAR | Status: DC | PRN
  Administered 2011-08-13: 0.4 mg via INTRAVENOUS

## 2011-08-13 MED ORDER — DEXAMETHASONE SODIUM PHOSPHATE 10 MG/ML IJ SOLN
INTRAMUSCULAR | Status: AC
  Filled 2011-08-13: qty 1

## 2011-08-13 MED ORDER — LIDOCAINE HCL (CARDIAC) 20 MG/ML IV SOLN
INTRAVENOUS | Status: AC
  Filled 2011-08-13: qty 5

## 2011-08-13 MED ORDER — DIPHENHYDRAMINE HCL 50 MG/ML IJ SOLN: 12.5000 mg | Freq: Four times a day (QID) | INTRAMUSCULAR | Status: DC | PRN

## 2011-08-13 MED ORDER — LACTATED RINGERS IV SOLN
INTRAVENOUS | Status: DC | PRN
  Administered 2011-08-13 (×3): via INTRAVENOUS

## 2011-08-13 MED ORDER — DIPHENHYDRAMINE HCL 12.5 MG/5ML PO ELIX: 12.5000 mg | ORAL_SOLUTION | Freq: Four times a day (QID) | ORAL | Status: DC | PRN

## 2011-08-13 MED ORDER — IBUPROFEN 600 MG PO TABS
600.0000 mg | ORAL_TABLET | Freq: Four times a day (QID) | ORAL | Status: DC | PRN
  Filled 2011-08-13: qty 1

## 2011-08-13 MED ORDER — LEVOTHYROXINE SODIUM 25 MCG PO TABS
12.5000 ug | ORAL_TABLET | Freq: Every day | ORAL | Status: DC
  Administered 2011-08-14: 25 ug via ORAL
  Filled 2011-08-13 (×2): qty 0.5

## 2011-08-13 MED ORDER — ATROPINE SULFATE 0.4 MG/ML IJ SOLN
INTRAMUSCULAR | Status: AC
  Filled 2011-08-13: qty 1

## 2011-08-13 MED ORDER — ROCURONIUM BROMIDE 50 MG/5ML IV SOLN
INTRAVENOUS | Status: AC
  Filled 2011-08-13: qty 1

## 2011-08-13 MED ORDER — SODIUM CHLORIDE 0.9 % IJ SOLN: 9.0000 mL | INTRAMUSCULAR | Status: DC | PRN

## 2011-08-13 MED ORDER — GLYCOPYRROLATE 0.2 MG/ML IJ SOLN
INTRAMUSCULAR | Status: DC | PRN
  Administered 2011-08-13 (×2): .4 mg via INTRAVENOUS

## 2011-08-13 SURGICAL SUPPLY — 45 items
ADH SKN CLS APL DERMABOND .7 (GAUZE/BANDAGES/DRESSINGS)
BARRIER ADHS 3X4 INTERCEED (GAUZE/BANDAGES/DRESSINGS) IMPLANT
BLADE LAPAROSCOPIC MORCELL KIT (BLADE) ×2 IMPLANT
BRR ADH 4X3 ABS CNTRL BYND (GAUZE/BANDAGES/DRESSINGS)
CABLE HIGH FREQUENCY MONO STRZ (ELECTRODE) ×4 IMPLANT
CANISTER SUCTION 2500CC (MISCELLANEOUS) ×4 IMPLANT
CLOTH BEACON ORANGE TIMEOUT ST (SAFETY) ×4 IMPLANT
CONTAINER PREFILL 10% NBF 15ML (MISCELLANEOUS) IMPLANT
DECANTER SPIKE VIAL GLASS SM (MISCELLANEOUS) IMPLANT
DERMABOND ADVANCED (GAUZE/BANDAGES/DRESSINGS)
DERMABOND ADVANCED .7 DNX12 (GAUZE/BANDAGES/DRESSINGS) IMPLANT
DRAPE UTILITY XL STRL (DRAPES) ×4 IMPLANT
DRSG COVADERM PLUS 2X2 (GAUZE/BANDAGES/DRESSINGS) ×2 IMPLANT
EVACUATOR SMOKE 8.L (FILTER) ×4 IMPLANT
FORCEPS CUTTING 33CM 5MM (CUTTING FORCEPS) ×2 IMPLANT
GLOVE BIO SURGEON STRL SZ 6.5 (GLOVE) ×12 IMPLANT
GOWN PREVENTION PLUS LG XLONG (DISPOSABLE) ×12 IMPLANT
NDL HYPO 25X1 1.5 SAFETY (NEEDLE) ×2 IMPLANT
NEEDLE HYPO 25X1 1.5 SAFETY (NEEDLE) IMPLANT
NS IRRIG 1000ML POUR BTL (IV SOLUTION) ×2 IMPLANT
OCCLUDER COLPOPNEUMO (BALLOONS) ×2 IMPLANT
PACK ABDOMINAL GYN (CUSTOM PROCEDURE TRAY) ×2 IMPLANT
PACK LAPAROSCOPY BASIN (CUSTOM PROCEDURE TRAY) ×4 IMPLANT
PAD OB MATERNITY 4.3X12.25 (PERSONAL CARE ITEMS) ×4 IMPLANT
SCISSORS LAP 5X35 DISP (ENDOMECHANICALS) ×2 IMPLANT
SET IRRIG TUBING LAPAROSCOPIC (IRRIGATION / IRRIGATOR) ×4 IMPLANT
SPATULA 33CM PLASMA (CUTTING FORCEPS) IMPLANT
SPONGE LAP 18X18 X RAY DECT (DISPOSABLE) ×4 IMPLANT
STAPLER VISISTAT 35W (STAPLE) ×2 IMPLANT
STRIP CLOSURE SKIN 1/4X3 (GAUZE/BANDAGES/DRESSINGS) IMPLANT
SUT PLAIN 2 0 XLH (SUTURE) IMPLANT
SUT VIC AB 0 CT1 18XCR BRD8 (SUTURE) IMPLANT
SUT VIC AB 0 CT1 27 (SUTURE)
SUT VIC AB 0 CT1 27XBRD ANBCTR (SUTURE) ×6 IMPLANT
SUT VIC AB 0 CT1 8-18 (SUTURE)
SUT VIC AB 3-0 PS1 18 (SUTURE)
SUT VIC AB 3-0 PS1 18X BRD (SUTURE) IMPLANT
SUT VICRYL 0 TIES 12 18 (SUTURE) ×2 IMPLANT
SUT VICRYL 0 UR6 27IN ABS (SUTURE) IMPLANT
SUT VICRYL 4-0 PS2 18IN ABS (SUTURE) ×4 IMPLANT
SYR CONTROL 10ML LL (SYRINGE) ×2 IMPLANT
TOWEL OR 17X24 6PK STRL BLUE (TOWEL DISPOSABLE) ×8 IMPLANT
TRAY FOLEY CATH 14FR (SET/KITS/TRAYS/PACK) ×4 IMPLANT
WARMER LAPAROSCOPE (MISCELLANEOUS) ×4 IMPLANT
WATER STERILE IRR 1000ML POUR (IV SOLUTION) ×4 IMPLANT

## 2011-08-13 NOTE — Anesthesia Preprocedure Evaluation (Addendum)
Anesthesia Evaluation  Name, MR# and DOB Patient awake  General Assessment Comment  Reviewed: Allergy & Precautions, H&P , NPO status , Patient's Chart, lab work & pertinent test results, reviewed documented beta blocker date and time   History of Anesthesia Complications Negative for: history of anesthetic complications  Airway Mallampati: I TM Distance: >3 FB Neck ROM: full    Dental  (+)    Pulmonary  clear to auscultation        Cardiovascular hypertension (173/62 today, took home meds), On Home Beta Blockers and On Medications regular Normal    Neuro/Psych Negative Neurological ROS  Negative Psych ROS   GI/Hepatic Neg liver ROS  hiatal hernia (no meds - avoids eating late at night)   Endo/Other  Diabetes mellitus-, Well Controlled, Type 2, Oral Hypoglycemic AgentsHypothyroidism Morbid obesity  Renal/GU negative Renal ROS  Genitourinary negative   Musculoskeletal   Abdominal   Peds  Hematology negative hematology ROS (+)   Anesthesia Other Findings No demerol IV Careful positioning of neck  Reproductive/Obstetrics negative OB ROS                           Anesthesia Physical Anesthesia Plan  ASA: III  Anesthesia Plan: General   Post-op Pain Management:    Induction: Intravenous  Airway Management Planned: Oral ETT  Additional Equipment:   Intra-op Plan:   Post-operative Plan:   Informed Consent: I have reviewed the patients History and Physical, chart, labs and discussed the procedure including the risks, benefits and alternatives for the proposed anesthesia with the patient or authorized representative who has indicated his/her understanding and acceptance.   Dental advisory given  Plan Discussed with: CRNA and Surgeon  Anesthesia Plan Comments:         Anesthesia Quick Evaluation

## 2011-08-13 NOTE — Anesthesia Postprocedure Evaluation (Signed)
Anesthesia Post Note  Patient: Hannah Parsons  Procedure(s) Performed:  LAPAROSCOPIC ASSISTED VAGINAL HYSTERECTOMY; SALPINGO OOPHERECTOMY  Anesthesia type: General  Patient location: PACU  Post pain: Pain level controlled  Post assessment: Post-op Vital signs reviewed  Last Vitals:  Filed Vitals:   08/13/11 1900  BP: 164/92  Pulse: 65  Temp:   Resp: 13    Post vital signs: Reviewed  Level of consciousness: sedated  Complications: No apparent anesthesia complicationsfj

## 2011-08-13 NOTE — Transfer of Care (Signed)
Immediate Anesthesia Transfer of Care Note  Patient: Hannah Parsons  Procedure(s) Performed:  LAPAROSCOPIC ASSISTED VAGINAL HYSTERECTOMY; SALPINGO OOPHERECTOMY  Patient Location: PACU  Anesthesia Type: General  Level of Consciousness: awake, alert , oriented and patient cooperative  Airway & Oxygen Therapy: Patient Spontanous Breathing and Patient connected to nasal cannula oxygen  Post-op Assessment: Report given to PACU RN, Post -op Vital signs reviewed and stable and Patient moving all extremities  Post vital signs: Reviewed and stable  Complications: No apparent anesthesia complications

## 2011-08-13 NOTE — Brief Op Note (Signed)
08/13/2011  5:58 PM  PATIENT:  Hannah Parsons  55 y.o. female  PRE-OPERATIVE DIAGNOSIS:  Abnormal uterine bleeding, endometrial polyps, symptomatic fibroids, history of endometrial hyperplasia  POST-OPERATIVE DIAGNOSIS:  abnormal uterine bleeding, endometrial polyps, symptomatic fibroids, history of endometrial hyperplasia, omental adhesions  PROCEDURE:  Procedure(s): LAPAROSCOPIC ASSISTED VAGINAL HYSTERECTOMY BILATERAL SALPINGO OOPHERECTOMY  SURGEON:  Surgeon(s): Onofre Gains A Silva  PHYSICIAN ASSISTANT:   ASSISTANTS: Miguel Aschoff  ANESTHESIA:   general  EBL:  Total I/O In: 2600 [I.V.:2600] Out: 450 [Urine:300; Blood:150]  BLOOD ADMINISTERED:none  DRAINS: Urinary Catheter (Foley)   LOCAL MEDICATIONS USED:  LIDOCAINE with EPI 1:100,000 CC  SPECIMEN:  Source of Specimen:  Uterus, cervix, tubes, and ovaries  DISPOSITION OF SPECIMEN:  PATHOLOGY  COUNTS:  YES  TOURNIQUET:  * No tourniquets in log *  DICTATION: .Other Dictation: Dictation Number   PLAN OF CARE: Admit for overnight observation  PATIENT DISPOSITION:  PACU - hemodynamically stable.   Delay start of Pharmacological VTE agent (>24hrs) due to surgical blood loss or risk of bleeding:  not applicable

## 2011-08-13 NOTE — Anesthesia Procedure Notes (Signed)
Procedure Name: Intubation Date/Time: 08/13/2011 3:03 PM Performed by: Suella Grove Pre-anesthesia Checklist: Patient identified, Patient being monitored, Emergency Drugs available, Timeout performed and Suction available Patient Re-evaluated:Patient Re-evaluated prior to inductionOxygen Delivery Method: Circle System Utilized Preoxygenation: Pre-oxygenation with 100% oxygen Intubation Type: IV induction Ventilation: Mask ventilation without difficulty Laryngoscope Size: Mac and 3 Grade View: Grade II Tube type: Oral Number of attempts: 1 Placement Confirmation: ETT inserted through vocal cords under direct vision,  positive ETCO2 and breath sounds checked- equal and bilateral Secured at: 21 cm Tube secured with: Tape Dental Injury: Teeth and Oropharynx as per pre-operative assessment

## 2011-08-13 NOTE — Progress Notes (Signed)
Pre- Op Visit  No marked change in status since office pre-op visit.

## 2011-08-14 LAB — BASIC METABOLIC PANEL
CO2: 31 mEq/L (ref 19–32)
Calcium: 8.8 mg/dL (ref 8.4–10.5)
Chloride: 104 mEq/L (ref 96–112)
Glucose, Bld: 123 mg/dL — ABNORMAL HIGH (ref 70–99)
Sodium: 140 mEq/L (ref 135–145)

## 2011-08-14 LAB — GLUCOSE, CAPILLARY: Glucose-Capillary: 122 mg/dL — ABNORMAL HIGH (ref 70–99)

## 2011-08-14 LAB — CBC
Hemoglobin: 12.1 g/dL (ref 12.0–15.0)
MCH: 28.9 pg (ref 26.0–34.0)
RBC: 4.19 MIL/uL (ref 3.87–5.11)
WBC: 9 10*3/uL (ref 4.0–10.5)

## 2011-08-14 MED ORDER — OXYCODONE-ACETAMINOPHEN 5-325 MG PO TABS: 1.0000 | ORAL_TABLET | ORAL | Status: AC | PRN

## 2011-08-14 MED ORDER — IBUPROFEN 600 MG PO TABS: 600.0000 mg | ORAL_TABLET | Freq: Four times a day (QID) | ORAL | Status: AC | PRN

## 2011-08-14 NOTE — Op Note (Signed)
NAMEORLANDRIA, Hannah Parsons NO.:  [id number]  MEDICAL RECORD NO.:  [id number]  LOCATION:  9312                          FACILITY:  [facility]  PHYSICIAN:  Randye Lobo, M.D.   DATE OF BIRTH:  1956/01/06  DATE OF PROCEDURE:  08/13/2011 DATE OF DISCHARGE:                              OPERATIVE REPORT   PREOPERATIVE DIAGNOSES: 1. Abnormal uterine bleeding. 2. Endometrial polyps. 3. Submucosal fibroid. 4. History of endometrial hyperplasia.  POSTOPERATIVE DIAGNOSES: 1. Abnormal uterine bleeding. 2. Endometrial polyps. 3. Submucosal fibroids. 4. History of endometrial hyperplasia. 5. Omental adhesions.  PROCEDURE:  A laparoscopically assisted vaginal hysterectomy with bilateral salpingo-oophorectomy.  SURGEON:  Randye Lobo, MD  ASSISTANT:  Miguel Aschoff, MD  ANESTHESIA:  General endotracheal, local with 1% lidocaine with epinephrine 1:100,000.  INTRAVENOUS FLUIDS:  2200 mL Ringer's lactate.  ESTIMATED BLOOD LOSS:  150 mL.  URINE OUTPUT:  300 mL.  COMPLICATIONS:  None.  INDICATIONS FOR THE PROCEDURE:  The patient is a 55 year old, gravida 1, para 1 Caucasian female with a history of prior endometrial hyperplasia who presents with a 6-year history of abnormal uterine bleeding.  The patient has received care by an outside physician and has transferred in for a second opinion before proceeding with potential hysterectomy which was the suggested treatment at this time.  The patient does have a history of endometrial polyps and uterine fibroids.  The patient has been treated in the past with Provera therapy along with surgical treatment with a D and C.  The patient continues to be premenopausal by [facility] levels.  When she came into my care after I reviewed her medical records, I requested an additional pelvic ultrasound which did document an endometrial thickness of 10.19-mm with multiple cystic areas within it along with 2 small 7.4 and 7.56 mm submucous  fibroids.  The right ovary was normal and the left ovary was not well seen.  Office endometrial biopsy on July 31, 2011, documented a benign polyp.  An FSH level measured 14.  The patient now requests definitive surgical treatment for her abnormal bleeding and a plan is, therefore made to proceed with a laparoscopic hysterectomy with bilateral salpingo-oophorectomy and a possible laparotomy with total abdominal hysterectomy and bilateral salpingo- oophorectomy.  Risks, benefits and alternatives have been extensively reviewed with the patient who wishes to proceed.  FINDINGS:  Examination under anesthesia revealed a minimal cervical descends.  Laparoscopy demonstrated a somewhat thick omental adhesions to the anterior abdominal wall, below the level of the umbilicus and up to the patient's right-hand side.  The posterior cul-de-sac demonstrated one small filmy adhesion between bowel and the cul-de-sac peritoneum.  The uterus, tubes and ovaries were unremarkable.  There was no evidence of any endometriosis in the abdomen or in the pelvis.  In the upper abdomen, the liver was not well visualized with the patient in the Trendelenburg position.  The patient's gallbladder has been surgically removed.  SPECIMENS:  The uterus with cervix and the bilateral tubes and ovaries were sent to pathology.  PROCEDURE:  The patient was reidentified in the preoperative hold area. She did receive cefotetan for antibiotic prophylaxis and both TED hose and PAS  stockings for DVT prophylaxis.  In the operating room, general endotracheal anesthesia was induced and the patient was then placed in the dorsal lithotomy position.  The abdomen, vagina and perineum were sterilely prepped and draped.  A Foley catheter was placed inside the bladder.  A speculum was placed inside the vagina and a single-tooth tenaculum was placed on the anterior cervical lip.  A Hulka tenaculum was used to replace the  single-tooth tenaculum and the speculum was withdrawn.  The procedure began by creating a 1-cm umbilical incision with a scalpel.  Dissection was carried down to the fascia using an Allis clamp.  An attempt was made to place the 10-mm trocar directly. However, the fascia was very strong.  A decision was made instead to use the Veress needle, which was placed without difficulty.  A saline drop test was performed and the saline flowed freely.  A CO2 pneumoperitoneum was then achieved without difficulty.  A 10-mm trocar was then inserted into the peritoneal cavity and the laparoscope confirmed proper placement.  A 5-mm incision was created on the left lower quadrant and a 5-mm trocar was placed under direct visualization of the laparoscope.  The laparoscopic scissors was then used to excise the omentum from the anterior abdominal wall.  Hemostasis was created with monopolar energy added to the scissors when necessary.  Eventually, a free space could be created so that a 5-mm right lower quadrant trocar could be placed through direct visualization of the laparoscope after the incision had been created with a scalpel.  A 100% of the adhesions were lysed and the anatomy was normalized.  The hysterectomy was begun at this time.  The left infundibulopelvic ligament was identified and the gyrus instrument was used to cauterize the infundibulopelvic ligament triply before it was cut.  Dissection was continued to the broad ligament using the same instrument.  The left round ligament was similarly grasped, cauterized, and bisected.  Dissection continued through the anterior leaf of the broad ligament again using the gyrus instrument and the bladder flap was partially created on the patient's left-hand side.  The uterine artery was cauterized, but not cut.  The same procedure that was performed on the patient's left hand side was repeated on the patient's right-hand side.  The uterine artery  on this side was cauterized with the gyrus instrument and cut.  At this time, the decision was made to proceed with the remainder of the procedure vaginally.  The CO2 pneumoperitoneum was released.  The patient was placed in the high lithotomy position.  The weighted speculum was placed inside the vagina and tenaculum was replaced on the anterior and posterior cervical lips.  The cervix was injected locally with 1% lidocaine with epinephrine 1:100,000.  The cervix was circumscribed with a scalpel.  The posterior cul-de-sac was entered sharply.  Digital exam confirmed proper entry into this location.  The long weighted speculum was placed inside the posterior cul-de-sac.  The uterosacral ligaments were then each clamped, sharply divided, and suture ligated with 0 Vicryl suture.  The suture on the left-hand side was a simple suture and the suture on the right-hand side was a transfixing suture.  Dissection of the bladder off of the anterior lower uterine segment was performed sharply with the Metzenbaum scissors.  The cardinal ligaments were then sequentially clamped, sharply divided, and suture ligated with 0 Vicryl sutures.  This was performed in multiple bites along each side of the cervix.  Sharp dissection was used to further dissect the  vesicouterine fold until the anterior cul-de-sac was entered.  Again, a visual and digital exam confirmed proper entry into this location.  The final pedicles were taken on each site so that the specimen could be removed.  All pedicles were tied with 0 Vicryl suture.  Hemostasis was good at this time.  A moistened 4 x 18 lap pad was placed inside the peritoneal cavity, and the posterior vaginal mucosa was closed with a running locked suture of 0 Vicryl.  A 4 x 18 sponge was removed at this time, and the entire vaginal cuff was closed with a running lock suture of 0 Vicryl.  Hemostasis was good.  At this time, the CO2 pneumoperitoneum was  achieved and the pelvis was then irrigated and suctioned.  The operative sites were examined.  The left infundibulopelvic ligament appeared to be somewhat bloody, although there was no real sign of active bleeding in this area.  The gyrus instrument was used to re-cauterize this pedicle.  All other pedicles were hemostatic.  The 5-mm trocars were removed under visualization of the laparoscope. The umbilical trocar and the laparoscope were removed simultaneously.  The umbilical incision was closed at this time with a suture of 0 Vicryl which was placed as close to the fascia as it was possible to reach. The depth of the abdominal wall was significant.  The skin incisions were then all closed with subcuticular sutures of 3-0 Vicryl.  Dermabond was placed over the incisions.  The patient was cleansed of Betadine, awakened, and extubated.  She was escorted to the recovery room in stable and awake condition.  There were no complications to the procedure.  All needle, instrument, and sponge counts were correct.     Randye Lobo, M.D.     BES/MEDQ  D:  08/13/2011  T:  08/14/2011  Job:  [id number]

## 2011-08-14 NOTE — Discharge Summary (Signed)
Physician Discharge Summary  Patient ID: Hannah Parsons MRN: [id number] DOB/AGE: [birth year] 55 y.o.  Admit date: 08/13/2011 Discharge date: 08/14/2011  Admission Diagnoses:  Abnormal uterine bleeding, endometrial polyps, submucous uterine fibroids, history of endometrial hyperplasia.  Adult onset diabetes mellitus.  Discharge Diagnoses:  Abnormal uterine bleeding, endometrial polyps, submucous uterine fibroids, history of endometrial hyperplasia.  Status post laparoscopically assisted vaginal hysterectomy with bilateral salpingo-oophorectomy with lysis of adhesions.  Adult onset diabetes.   Active Problems:  * No active hospital problems. *    Discharged Condition: Good.  Hospital Course: Patient had an uncomplicated surgery and post operative recovery.  By post op day number one, the patient voided spontaneously, had good pain control with Percocet and Ibuprophen, tolerated a regular diet, and ambulated spontaneously.    Her diabetes was controlled with an insulin sliding scale while she was not yet tolerating a regular diet.  Her oral hypoglycemics will be resumed at home.  Consults: None.  Significant Diagnostic Studies:    Treatments: Status post laparoscopically assisted vaginal hysterectomy with bilateral salpingo-oophorectomy with lysis of adhesions.   Discharge Exam: Blood pressure 116/71, pulse 77, temperature 98.2 F (36.8 C), temperature source Oral, resp. rate 18, height 5\' 6"  (1.676 m), weight 120.203 kg (265 lb), SpO2 96.00%. Abdomen:  Obese, soft, nontender, nondistended.  Incisions clean, dry, intact. Vaginal pad:  Minimal bleeding.  Disposition: Home or Self Care  Discharge Orders    Future Orders Please Complete By Expires   Diet - low sodium heart healthy      Comments:   Please also follow your proper diabetic diet.   Increase activity slowly      Driving Restrictions      Comments:   None for 1 -2 weeks.   Lifting restrictions      Comments:     Nothing over ten pounds for 6 weeks.   Sexual Activity Restrictions      Comments:   Nothing for 6 weeks.   Other Restrictions      Comments:   No exercises for 6 weeks.     Current Discharge Medication List    START taking these medications   Details  ibuprofen (ADVIL,MOTRIN) 600 MG tablet Take 1 tablet (600 mg total) by mouth every 6 (six) hours as needed for pain (mild pain). Qty: 30 tablet    oxyCODONE-acetaminophen (PERCOCET) 5-325 MG per tablet Take 1-2 tablets by mouth every 3 (three) hours as needed (moderate to severe pain (when tolerating fluids)). Qty: 30 tablet      CONTINUE these medications which have NOT CHANGED   Details  carvedilol (COREG) 6.25 MG tablet Take 1 tablet (6.25 mg total) by mouth 2 (two) times daily. Qty: 180 tablet, Refills: 1   Associated Diagnoses: Unspecified essential hypertension    !! levothyroxine (LEVOXYL) 150 MCG tablet Take 1 tablet (150 mcg total) by mouth daily. Qty: 90 tablet, Refills: 1   Associated Diagnoses: Unspecified hypothyroidism    ramipril (ALTACE) 5 MG capsule Take 1 capsule (5 mg total) by mouth daily. Qty: 90 capsule, Refills: 1   Associated Diagnoses: Unspecified essential hypertension    acyclovir (ZOVIRAX) 400 MG tablet Take 500 mg by mouth daily as needed. For fever blisters     Cholecalciferol (VITAMIN D3) 2000 UNITS TABS Take 2,000 Units by mouth daily.      glucose blood (ONE TOUCH ULTRA TEST) test strip 1 each by Other route. Check blood sugar once daily DX: 250.00     !! levothyroxine (SYNTHROID,  LEVOTHROID) 25 MCG tablet Take 12.5 mcg by mouth daily at 2 PM daily at 2 PM. 1/2 daily     Melatonin 5 MG TABS Take 5 mg by mouth at bedtime.      !! metFORMIN (GLUCOPHAGE-XR) 500 MG 24 hr tablet Take 500 mg by mouth daily with breakfast.      !! metFORMIN (GLUCOPHAGE-XR) 500 MG 24 hr tablet Take 500 mg by mouth daily after breakfast. 1 twice a day with 2 largest meals     Omega-3 Fatty Acids (FISH OIL) 1000  MG CAPS Take 1 capsule by mouth daily.      ONETOUCH DELICA LANCETS MISC by Does not apply route. Check blood sugar daily DX:250.00     Polyethylene Glycol 3350 (MIRALAX PO) Take 17 g by mouth daily.     pravastatin (PRAVACHOL) 20 MG tablet Take 1 tablet (20 mg total) by mouth every evening. Qty: 90 tablet, Refills: 0   Associated Diagnoses: Other and unspecified hyperlipidemia     !! - Potential duplicate medications found. Please discuss with provider.     Follow-up Information    Follow up with SILVA,BROOK A in 3 weeks.   Contact information:   [address] [place] [address] [phone]          Signed: Melony Overly 08/14/2011, 11:49 AM

## 2011-08-14 NOTE — Progress Notes (Signed)
Encounter addended by: Suella Grove on: 08/14/2011 10:49 AM<BR>     Documentation filed: Notes Section

## 2011-08-14 NOTE — Anesthesia Postprocedure Evaluation (Signed)
  Anesthesia Post-op Note  Patient: Hannah Parsons  Procedure(s) Performed:  LAPAROSCOPIC ASSISTED VAGINAL HYSTERECTOMY; SALPINGO OOPHERECTOMY  Patient Location: PACU  Anesthesia Type: General  Level of Consciousness: awake, alert  and patient cooperative  Airway and Oxygen Therapy: Patient Spontanous Breathing  Post-op Pain: none  Post-op Assessment: Patient's Cardiovascular Status Stable  Post-op Vital Signs: Reviewed and stable  Complications: No apparent anesthesia complications

## 2011-08-14 NOTE — Progress Notes (Signed)
POD 1- Late entry.  Patient seen at 7:45 am  S- Good pain control.  Foley out this am. O- AVSS.  UO - 3625 cc.  Lungs - CTA bilaterally.  Cor - S1S2 RRR.  Abdomen - + bowel sounds, soft, nontender.  Incisions - clean, dry, intact.  Labs - CBGs - 120s - 170.  Hgb 12.1 A/P- S/P LAVH.  Doing well.  Diet advance to normal.  Po pain meds.  Ambulate.  Anticipate discharge home.  I d/w patient surgical findings and procedure.

## 2011-08-16 ENCOUNTER — Encounter (HOSPITAL_COMMUNITY): Payer: Self-pay | Admitting: Obstetrics and Gynecology

## 2011-09-01 ENCOUNTER — Telehealth: Payer: Self-pay | Admitting: *Deleted

## 2011-09-01 NOTE — Telephone Encounter (Signed)
Pt states that BP is still running high ranging in 170s and 95s. Pt indicated that when she takes med it brings BP down some but not in the range preferred then BP spikes up again. Pt notes that BP was 145/85. Pt would like to know if she needs OV or if some changes can be made to med. Pt has increased both med to extra half tab on her own with no change in BP reading.Please advise

## 2011-09-21 NOTE — Telephone Encounter (Signed)
Spoke with Pt who states that BP is now within range and she is asymptomatic at this time.

## 2011-11-22 ENCOUNTER — Other Ambulatory Visit: Payer: Self-pay | Admitting: Internal Medicine

## 2011-11-23 NOTE — Telephone Encounter (Signed)
Patient needs to schedule a lab appointment A1c 250.00 

## 2011-12-26 ENCOUNTER — Other Ambulatory Visit: Payer: Self-pay | Admitting: Internal Medicine

## 2012-01-10 ENCOUNTER — Other Ambulatory Visit: Payer: Self-pay | Admitting: Internal Medicine

## 2012-01-11 NOTE — Telephone Encounter (Signed)
Prescription sent to pharmacy.

## 2012-02-07 ENCOUNTER — Other Ambulatory Visit: Payer: Self-pay | Admitting: Internal Medicine

## 2012-02-08 NOTE — Telephone Encounter (Signed)
A1C 250.00 

## 2012-03-14 ENCOUNTER — Other Ambulatory Visit: Payer: Self-pay | Admitting: Internal Medicine

## 2012-03-14 DIAGNOSIS — E039 Hypothyroidism, unspecified: Secondary | ICD-10-CM

## 2012-03-14 NOTE — Telephone Encounter (Signed)
Patient had TSH drawn today, patient aware once results are back and reviewed by another MD (Dr.Hopper out of office this week) medication will be filled based on results of TSH.

## 2012-03-14 NOTE — Telephone Encounter (Signed)
Patient states she can not call her pharmacy as she goes through [facility] Needs refills on 2-different strengths of Levoxyl 25 & 150  Last ov 9.19 Has follow up sched for 5.28

## 2012-03-15 ENCOUNTER — Ambulatory Visit: Payer: 59 | Admitting: Internal Medicine

## 2012-03-15 NOTE — Telephone Encounter (Signed)
I called Dr.Loewe and she states she faxed labs over today, informed me to call back if not received

## 2012-03-21 MED ORDER — LEVOTHYROXINE SODIUM 150 MCG PO TABS: 150.0000 ug | ORAL_TABLET | Freq: Every day | ORAL | Status: DC

## 2012-03-21 MED ORDER — LEVOTHYROXINE SODIUM 25 MCG PO TABS: ORAL_TABLET | ORAL | Status: DC

## 2012-03-21 NOTE — Telephone Encounter (Signed)
Labs received and reviewed by MD, rx's sent in 90/3 to mail order and 30 day on 150 mg dose to local for patient will run out of that dose in 2 days. Copy of labs sent to scanning

## 2012-03-30 ENCOUNTER — Ambulatory Visit: Payer: 59 | Admitting: Internal Medicine

## 2012-04-05 ENCOUNTER — Encounter: Payer: Self-pay | Admitting: Internal Medicine

## 2012-04-05 ENCOUNTER — Ambulatory Visit (INDEPENDENT_AMBULATORY_CARE_PROVIDER_SITE_OTHER): Payer: 59 | Admitting: Internal Medicine

## 2012-04-05 VITALS — BP 128/90 | HR 63 | Wt 264.0 lb

## 2012-04-05 DIAGNOSIS — E785 Hyperlipidemia, unspecified: Secondary | ICD-10-CM

## 2012-04-05 DIAGNOSIS — E119 Type 2 diabetes mellitus without complications: Secondary | ICD-10-CM

## 2012-04-05 DIAGNOSIS — N852 Hypertrophy of uterus: Secondary | ICD-10-CM

## 2012-04-05 DIAGNOSIS — I10 Essential (primary) hypertension: Secondary | ICD-10-CM

## 2012-04-05 DIAGNOSIS — E039 Hypothyroidism, unspecified: Secondary | ICD-10-CM

## 2012-04-05 NOTE — Progress Notes (Signed)
  Subjective:    Patient ID: Hannah Parsons, female    DOB: February 05, 1956, 56 y.o.   MRN: [id number]  HPI HYPERTENSION: Disease Monitoring: Blood pressure range-120-136/ <90, usually diastolic in low 80s  Chest pain, palpitations- no , pulse runs in 50s since Ramipril started       Dyspnea-no Medications: Compliance- yes  Lightheadedness,Syncope-no    Edema- no  DIABETES: Disease Monitoring: Blood Sugar ranges-99-114  Polyuria/phagia/dipsia- no       Visual problems- no Medications: Compliance- yes  Hypoglycemic symptoms- no  HYPERLIPIDEMIA: Disease Monitoring: See symptoms for Hypertension Medications: Compliance- off statin 1 year Abd pain, bowel changes- "slow bowel"   Muscle aches- no         Review of Systems Since her last visit she's had  Total lap hysterectomy.  7 days ago she pulled a tick from her right lower back. She denies any fever, chills, sweats, or significant rash.  Labs were done on 03/14/12 at [facility]; these were reviewed    Objective:   Physical Exam Gen.: well-nourished in appearance. Alert, appropriate and cooperative throughout exam. Eyes: No corneal or conjunctival inflammation noted. Ophth exam scheduled this month  Neck: No deformities, masses, or tenderness noted. Thyroid normal Lungs: Normal respiratory effort; chest expands symmetrically. Lungs are clear to auscultation without rales, wheezes, or increased work of breathing. Heart: Normal rate and rhythm. Normal S1 and S2. No gallop, click, or rub. S4 w/o murmur. Abdomen: Bowel sounds normal; abdomen soft and nontender. No masses, organomegaly or hernias noted.                                     Musculoskeletal/extremities: No clubbing, cyanosis, edema, or deformity noted. Range of motion  normal .Tone & strength  normal.Joints normal. Nail health  good. Vascular: Carotid, radial artery, dorsalis pedis and  posterior tibial pulses are full and equal. No bruits present. Neurologic: Alert  and oriented x3. Deep tendon reflexes symmetrical and normal.   Light touch normal over feet.         Skin: Intact without suspicious lesions or rashes. Minor excoriated lesion upper back. There is minor erythema at the site of the tick bite. Lymph: No cervical, axillary lymphadenopathy present. Psych: Mood and affect are normal. Normally interactive                                                                                         Assessment & Plan:

## 2012-04-05 NOTE — Assessment & Plan Note (Signed)
TG 170; LDL 91;HDL 40.Interventions to raise HDL or GOOD cholesterol discussed . These include exercising 30-45 minutes 3-4 X per week; including salmon & tuna in the diet;  & supplementing with Omega 3 fatty acids (Flax or Fish oil )  1-2 grams per day. The B vitamin Niacin also raises HDL but has a vasodilating effect which may cause flushing.

## 2012-04-05 NOTE — Assessment & Plan Note (Signed)
A1c GOALS  Excellent diabetic control: 6.1-6.5 %  Fair diabetic control: 6.5-7%  Poor diabetic control: greater than 7 % ( except with additional factors such as  advanced age; significant coronary or neurologic disease,etc). Check the A1c every 6 months if it is < 6.5%; every 4 months if  6.5% or higher. Goals for home glucose monitoring are : fasting  or morning glucose goal of  90-150. Two hours after any meal , goal = < 180, preferably < 160. To report any low blood glucoses immediately. All other labs are excellent.[employer]

## 2012-04-05 NOTE — Assessment & Plan Note (Signed)
TSH ideal @ 1.894.No change in medications is  indicated

## 2012-04-05 NOTE — Assessment & Plan Note (Signed)
BP well controlled Urine microalbumin 4.50; no BMET.No change in medications is  indicated

## 2012-04-05 NOTE — Patient Instructions (Addendum)
The most common cause of elevated triglycerides is the ingestion of sugar from high fructose corn syrup sources added to processed foods & drinks.  Eat a low-fat diet with lots of fruits and vegetables, up to 7-9 servings per day. Consume less than 30 (preferably ZERO) grams of sugar per day from foods & drinks with High Fructose Corn Syrup (HFCS) sugar as #1,2,3 or # 4 on label.Whole Foods, [employer] & [employer] do not carry products with HFCS. Follow a  low carb nutrition program such as [place] or The [employer] Sugar [employer]usters  to prevent Diabetes progression . White carbohydrates (potatoes, rice, bread, and pasta) have a high spike of sugar and a high load of sugar. For example a  baked potato has a cup of sugar and a  french fry  2 teaspoons of sugar. Yams, wild  rice, whole grained bread &  wheat pasta have been much lower spike and load of  sugar. Portions should be the size of a deck of cards or your palm.  Please  schedule fasting Labs in 6 mos : BMET,Lipids, A1c, urine microalbumin. PLEASE BRING THESE INSTRUCTIONS TO FOLLOW UP  LAB APPOINTMENT.This will guarantee correct labs are drawn, eliminating need for repeat blood sampling ( needle sticks ! ). Diagnoses /Codes: 272.4, 401.9,250.00. Blood Pressure Goal  Ideally is an AVERAGE < 135/85. This AVERAGE should be calculated from @ least 5-7 BP readings taken @ different times of day on different days of week. You should not respond to isolated BP readings , but rather the AVERAGE for that week   Please report any fever, rash, or headache.

## 2012-04-05 NOTE — Assessment & Plan Note (Signed)
Lap TAH & BSO 08/13/11 w/o complication

## 2012-05-07 ENCOUNTER — Other Ambulatory Visit: Payer: Self-pay | Admitting: Internal Medicine

## 2012-05-17 ENCOUNTER — Other Ambulatory Visit: Payer: Self-pay | Admitting: Internal Medicine

## 2012-05-17 DIAGNOSIS — Z1231 Encounter for screening mammogram for malignant neoplasm of breast: Secondary | ICD-10-CM

## 2012-05-23 ENCOUNTER — Ambulatory Visit
Admission: RE | Admit: 2012-05-23 | Discharge: 2012-05-23 | Disposition: A | Discharge status: Home / Self Care | Payer: 59 | Source: Ambulatory Visit | Attending: Internal Medicine | Admitting: Internal Medicine

## 2012-05-23 DIAGNOSIS — Z1231 Encounter for screening mammogram for malignant neoplasm of breast: Secondary | ICD-10-CM

## 2012-08-31 ENCOUNTER — Other Ambulatory Visit: Payer: Self-pay | Admitting: Internal Medicine

## 2012-09-09 ENCOUNTER — Encounter: Payer: Self-pay | Admitting: Internal Medicine

## 2012-10-14 ENCOUNTER — Telehealth: Payer: Self-pay | Admitting: Internal Medicine

## 2012-10-14 MED ORDER — METFORMIN HCL ER 500 MG PO TB24: 500.0000 mg | ORAL_TABLET | Freq: Every day | ORAL | Status: DC

## 2012-10-14 MED ORDER — RAMIPRIL 5 MG PO CAPS: ORAL_CAPSULE | ORAL | Status: DC

## 2012-10-14 NOTE — Telephone Encounter (Signed)
Refill: Metformin xl 500 mg & Ramipril 2.5mg . Request sent from [id number] in [place], [place]. Pharmacy note: Pt is on vacation and left meds at home.

## 2012-10-14 NOTE — Telephone Encounter (Signed)
RXs sent.

## 2012-11-02 HISTORY — PX: UPPER GI ENDOSCOPY: SHX6162

## 2012-11-02 HISTORY — PX: COLONOSCOPY: SHX174

## 2012-12-17 ENCOUNTER — Other Ambulatory Visit: Payer: Self-pay

## 2013-02-23 ENCOUNTER — Encounter: Payer: Self-pay | Admitting: Internal Medicine

## 2013-02-27 ENCOUNTER — Other Ambulatory Visit: Payer: Self-pay | Admitting: Internal Medicine

## 2013-02-28 NOTE — Telephone Encounter (Signed)
Refill done.  

## 2013-03-14 ENCOUNTER — Telehealth: Payer: Self-pay | Admitting: Internal Medicine

## 2013-03-14 NOTE — Telephone Encounter (Signed)
Pt states she recently had 2 surgical procedures and is healed from these. Pt would like to schedule EGD and colon with Dr. Marina Goodell. Pt scheduled to see Dr. Marina Goodell 03/31/13@8 :30am for OV. Pt aware of appt date and time.

## 2013-03-23 ENCOUNTER — Telehealth: Payer: Self-pay | Admitting: Internal Medicine

## 2013-03-23 MED ORDER — CARVEDILOL 6.25 MG PO TABS: ORAL_TABLET | ORAL | Status: DC

## 2013-03-23 MED ORDER — RAMIPRIL 5 MG PO CAPS: ORAL_CAPSULE | ORAL | Status: DC

## 2013-03-23 NOTE — Telephone Encounter (Signed)
Please  schedule fasting pre 6/6 appt: BMET, TSH.  Diagnoses /Codes: 401.9,244.9. Refill meds  X 30 days

## 2013-03-23 NOTE — Telephone Encounter (Signed)
Pt called and wanted to get a refill on her ramipril, carvedilol, and synthroid [facility] [address]. Also patient made an appt in June for her 6 month check up but wanted to see if she needed to lab work before her visit. Thanks

## 2013-03-23 NOTE — Telephone Encounter (Signed)
Called patient LMOVM for patient to call office.

## 2013-03-23 NOTE — Telephone Encounter (Signed)
See response

## 2013-03-23 NOTE — Telephone Encounter (Signed)
Please advise on labs orders needed prior to appt

## 2013-03-29 ENCOUNTER — Other Ambulatory Visit (INDEPENDENT_AMBULATORY_CARE_PROVIDER_SITE_OTHER): Payer: BC Managed Care – PPO

## 2013-03-29 DIAGNOSIS — E785 Hyperlipidemia, unspecified: Secondary | ICD-10-CM

## 2013-03-29 DIAGNOSIS — E119 Type 2 diabetes mellitus without complications: Secondary | ICD-10-CM

## 2013-03-29 DIAGNOSIS — E039 Hypothyroidism, unspecified: Secondary | ICD-10-CM

## 2013-03-29 DIAGNOSIS — I10 Essential (primary) hypertension: Secondary | ICD-10-CM

## 2013-03-29 LAB — BASIC METABOLIC PANEL
CO2: 27 mEq/L (ref 19–32)
GFR: 91.47 mL/min (ref >60.00)
Glucose, Bld: 117 mg/dL — ABNORMAL HIGH (ref 70–99)
Potassium: 4.2 mEq/L (ref 3.5–5.1)
Sodium: 137 mEq/L (ref 135–145)

## 2013-03-29 LAB — LIPID PANEL
HDL: 40.5 mg/dL (ref >39.00)
Triglycerides: 189 mg/dL — ABNORMAL HIGH (ref 0.0–149.0)
VLDL: 37.8 mg/dL (ref 0.0–40.0)

## 2013-03-29 LAB — MICROALBUMIN / CREATININE URINE RATIO: Microalb, Ur: 1 mg/dL (ref 0.0–1.9)

## 2013-03-31 ENCOUNTER — Ambulatory Visit (INDEPENDENT_AMBULATORY_CARE_PROVIDER_SITE_OTHER): Payer: BC Managed Care – PPO | Admitting: Internal Medicine

## 2013-03-31 ENCOUNTER — Encounter: Payer: Self-pay | Admitting: Internal Medicine

## 2013-03-31 VITALS — BP 140/88 | HR 72 | Ht 66.5 in | Wt 283.1 lb

## 2013-03-31 DIAGNOSIS — E119 Type 2 diabetes mellitus without complications: Secondary | ICD-10-CM

## 2013-03-31 DIAGNOSIS — K5900 Constipation, unspecified: Secondary | ICD-10-CM

## 2013-03-31 DIAGNOSIS — Z86010 Personal history of colonic polyps: Secondary | ICD-10-CM

## 2013-03-31 DIAGNOSIS — R1310 Dysphagia, unspecified: Secondary | ICD-10-CM

## 2013-03-31 MED ORDER — MOVIPREP 100 G PO SOLR: 1.0000 | Freq: Once | ORAL | Status: DC

## 2013-03-31 NOTE — Progress Notes (Signed)
HISTORY OF PRESENT ILLNESS:  Hannah Parsons is a 57 y.o. female with hypertension, hypothyroidism, hyperlipidemia, diabetes mellitus, obesity, and adenomatous colon polyps. The patient was last evaluated in may of 2012 regarding dysphagia. She was to have had upper endoscopy but canceled she subsequently required surgery (cholecystectomy and subsequent hysterectomy). She presents today regarding surveillance colonoscopy and ongoing problems with dysphagia. In terms of dysphagia, she describes intermittent pill and solid food dysphagia. She points to the cervical esophageal region. Occasional trouble with liquids. She also reports her grandmother having had "stomach cancer" with similar presenting symptoms. The patient has had no weight loss. She denies reflux symptoms. She does have chronic constipation for which she takes MiraLax. Her last colonoscopy was in November of 2010. 5 polyps removed at that time. Followup in 3 years recommended. GI review of systems is otherwise negative. Her chronic medical problems are stable. She does not take insulin or blood thinners. Recent outside laboratories reviewed and found to be unremarkable REVIEW OF SYSTEMS:  All non-GI ROS negative after comprehensive review  Past Medical History  Diagnosis Date  . Hypertension   . Hypothyroidism   . Hyperlipidemia   . Diabetes mellitus   . History of cholelithiasis   . History of nephrolithiasis     suspected  . History of colon polyps     hyperplastic  . Hiatal hernia     Past Surgical History  Procedure Laterality Date  . Dilation and curettage of uterus      Dr.Gaccione  . Appendectomy    . Ovarian cyst removal  1984  . Colonoscopy w/ polypectomy      2 adenmas & 1 hyperplastic  polyp, Due 2013. Dr.Monty Spicher  . Tear duct probing  10/2009  . Cholecystectomy, laparoscopic  05/2011    Dr Derrell Lolling  . Cholecystectomy    . Right leg patella    . Svd      x 1  . Wisdom tooth extraction    . Laparoscopic assisted  vaginal hysterectomy  08/13/2011    Procedure: LAPAROSCOPIC ASSISTED VAGINAL HYSTERECTOMY;  Surgeon: Melony Overly;  Location: [facility];  Service: Gynecology;  Laterality: N/A;  . Salpingoophorectomy  08/13/2011    Procedure: SALPINGO OOPHERECTOMY;  Surgeon: Melony Overly;  Location: [facility];  Service: Gynecology;  Laterality: Bilateral;    Social History Grier A Fons  reports that she has never smoked. She has never used smokeless tobacco. She reports that  drinks alcohol. She reports that she does not use illicit drugs.  family history includes Colon cancer (age of onset: 59) in her maternal grandfather; Esophageal cancer in her maternal grandmother; Heart failure in her father and paternal grandmother; Hypertension in an unspecified family member; Lymphoma in her mother; and Lymphoma (age of onset: 75) in her maternal grandfather.  There is no history of Diabetes and Stroke.  Allergies  Allergen Reactions  . Codeine Sulfate     REACTION: hives  . Erythromycin Base     REACTION: stomach cramps, flu-like symptoms  . Losartan Potassium     REACTION: PATIENT DIDNT FEEL WELL WHILE TAKING; intercostal chest pain, fatigue  . Meperidine Hcl     REACTION: vomiting       PHYSICAL EXAMINATION: Vital signs: BP 140/88  Pulse 72  Ht 5' 6.5" (1.689 m)  Wt 283 lb 2 oz (128.425 kg)  BMI 45.02 kg/m2  Constitutional: Obese, generally well-appearing, no acute distress Psychiatric: alert and oriented x3, cooperative Eyes: extraocular movements intact, anicteric, conjunctiva pink  Mouth: oral pharynx moist, no lesions Neck: supple no lymphadenopathy Cardiovascular: heart regular rate and rhythm, no murmur Lungs: clear to auscultation bilaterally Abdomen: soft, obese, nontender, nondistended, no obvious ascites, no peritoneal signs, normal bowel sounds, no organomegaly Rectal: Deferred until colonoscopy Extremities: no lower extremity edema bilaterally Skin: no lesions on visible  extremities Neuro: No focal deficits.   ASSESSMENT:  #1. Intermittent solid food and pill dysphagia. Rule out stricture or ring. #2. History of adenomatous colon polyps. Due for surveillance colonoscopy #3. Multiple medical problems including diabetes mellitus #4. Chronic constipation. Stable   PLAN:  #1. Upper endoscopy with possible esophageal dilation. The nature of the procedure, as well as the risks, benefits, and alternatives were carefully and thoroughly reviewed with the patient. Ample time for discussion and questions allowed. The patient understood, was satisfied, and agreed to proceed. #2. Surveillance colonoscopy. The nature of the procedure, as well as the risks, benefits, and alternatives were carefully and thoroughly reviewed with the patient. Ample time for discussion and questions allowed. The patient understood, was satisfied, and agreed to proceed. #3. Movi prep prescribed. The patient instructed on its use #4. Hold diabetic medications the day of the procedure until normal by mouth intake resumed #5. MiraLax as needed for constipation

## 2013-03-31 NOTE — Patient Instructions (Addendum)
You have been scheduled for an endoscopy with possible dilation a and colonoscopy with propofol. Please follow the written instructions given to you at your visit today. Please pick up your prep at the pharmacy within the next 1-3 days.  If you use inhalers (even only as needed), please bring them with you on the day of your procedure.

## 2013-04-03 ENCOUNTER — Encounter: Payer: Self-pay | Admitting: Internal Medicine

## 2013-04-03 ENCOUNTER — Ambulatory Visit (INDEPENDENT_AMBULATORY_CARE_PROVIDER_SITE_OTHER): Payer: BC Managed Care – PPO | Admitting: Internal Medicine

## 2013-04-03 VITALS — BP 130/78 | HR 78 | Resp 12 | Wt 281.0 lb

## 2013-04-03 DIAGNOSIS — E785 Hyperlipidemia, unspecified: Secondary | ICD-10-CM

## 2013-04-03 DIAGNOSIS — E039 Hypothyroidism, unspecified: Secondary | ICD-10-CM

## 2013-04-03 DIAGNOSIS — I10 Essential (primary) hypertension: Secondary | ICD-10-CM

## 2013-04-03 DIAGNOSIS — E119 Type 2 diabetes mellitus without complications: Secondary | ICD-10-CM

## 2013-04-03 MED ORDER — LEVOTHYROXINE SODIUM 25 MCG PO TABS: ORAL_TABLET | ORAL | Status: DC

## 2013-04-03 MED ORDER — LEVOTHYROXINE SODIUM 150 MCG PO TABS: 150.0000 ug | ORAL_TABLET | Freq: Every day | ORAL | Status: DC

## 2013-04-03 NOTE — Assessment & Plan Note (Signed)
LDL is at goal of less than 100. Triglycerides remain elevated; pathophysiology of hypertriglyceridemia discussed. Nutritional interventions emphasized

## 2013-04-03 NOTE — Assessment & Plan Note (Signed)
TSH is ideal at 1.6; no change indicated

## 2013-04-03 NOTE — Assessment & Plan Note (Signed)
A1c is now at the diabetes cut off. She has no elevation of the micro-albumen. No change indicated. Lipids and A1c recommended in 6 months

## 2013-04-03 NOTE — Assessment & Plan Note (Signed)
Blood pressure diary indicates excellent control. Renal function is normal. No change indicated. The ramipril is nephro protective

## 2013-04-03 NOTE — Patient Instructions (Addendum)
Share results with all non [facility] medical staff seen .  Please repeat A1c and fasting lipids in 6 months.

## 2013-04-03 NOTE — Progress Notes (Signed)
Subjective:    Patient ID: Hannah Parsons, female    DOB: 1956/09/25, 57 y.o.   MRN: [id number]  HPI  She returns to review lab work related to her hypothyroidism, hypertension, and diabetes. The most recent A1c 5/28 was 6.5% , which correlates to an average sugar of 140 , and long-term risk of 30% . Fasting blood sugar range 109-119  Highest two-hour postprandial glucose is < 150 . No hypoglycemia reported Last ophthalmologic examination 4/14  revealed no retinopathy. No active podiatry assessment on record. Diet is low carb ;heart healthy. Exercise  irregular 3-4 X/ week as walking  for > 30 minutes.  The most recent lipids 5/28  reveal LDL 93 ; HDL 40.5 ; and triglycerides  189  . She is not on  statin.Statin caused debilitating headaches  Blood pressure  average 120s/70s, high 140/88 . There is medical   with antihypertensive medications  No  medication adverse effects suggested     Review of Systems  Constitutional: Stress eating with 10# weight gain;  excess fatigue Eyes: No  blurred vision;  double vision ; loss of vision Cardiovascular: no chest pain ;palpitations; racing; irregular rhythm ;syncope; claudication ; edema  Respiratory: No exertional dyspnea;  paroxysmal nocturnal dyspnea Genitourinary: No dysuria; hematuria ; pyuria; frequency;  Incontinence;  nocturia Musculoskeletal: No myalgias or muscle cramping  Dermatologic: No non healing  Lesions; change in color or temperature of skin Neurologic: No  limb weakness;  numbness or tingling;  burning Endocrine: No change in hair, skin, nails. No excessive thirst; excessive hunger;  excessive urination       Objective:   Physical Exam Gen.: well-nourished in appearance. Alert, appropriate and cooperative throughout exam. Eyes: No corneal or conjunctival inflammation noted.  Nose: External nasal exam reveals no deformity or inflammation. Nasal mucosa are pink and moist. No lesions or exudates noted.   Mouth: Oral  mucosa and oropharynx reveal no lesions or exudates. Teeth in good repair. Neck: No deformities, masses, or tenderness noted.  Thyroid normal. Lungs: Normal respiratory effort; chest expands symmetrically. Lungs are clear to auscultation without rales, wheezes, or increased work of breathing. Heart: Normal rate and rhythm. Normal S1 and S2. No gallop, click, or rub. S4 w/o murmur. Abdomen: Bowel sounds normal; abdomen soft and nontender. No masses, organomegaly or hernias noted.                                 Musculoskeletal/extremities: Slightly accentuated curvature of upper thoracic spine. No clubbing, cyanosis, edema, or significant extremity  deformity noted. Range of motion normal .Tone & strength  Normal. Joints normal. Nail health good. Able to lie down & sit up w/o help. Negative SLR bilaterally Vascular: Carotid, radial artery, dorsalis pedis and  posterior tibial pulses are full and equal. No bruits present. Neurologic: Alert and oriented x3. Deep tendon reflexes symmetrical and normal.  Light touch normal over feet. Skin: Intact without suspicious lesions or rashes. Lymph: No cervical, axillary lymphadenopathy present. Psych: Mood and affect are normal. Normally interactive  Assessment & Plan:  See Current Assessment & Plan in Problem List under specific Diagnosis

## 2013-04-28 ENCOUNTER — Other Ambulatory Visit: Payer: Self-pay | Admitting: Internal Medicine

## 2013-04-28 NOTE — Telephone Encounter (Signed)
Refill done.  

## 2013-06-06 ENCOUNTER — Ambulatory Visit (AMBULATORY_SURGERY_CENTER): Payer: BC Managed Care – PPO | Admitting: Internal Medicine

## 2013-06-06 ENCOUNTER — Encounter: Payer: Self-pay | Admitting: Internal Medicine

## 2013-06-06 VITALS — BP 178/79 | HR 61 | Temp 97.7°F | Resp 22 | Ht 66.5 in | Wt 283.0 lb

## 2013-06-06 DIAGNOSIS — Z86010 Personal history of colonic polyps: Secondary | ICD-10-CM

## 2013-06-06 DIAGNOSIS — K253 Acute gastric ulcer without hemorrhage or perforation: Secondary | ICD-10-CM

## 2013-06-06 DIAGNOSIS — R1310 Dysphagia, unspecified: Secondary | ICD-10-CM

## 2013-06-06 LAB — GLUCOSE, CAPILLARY
Glucose-Capillary: 113 mg/dL — ABNORMAL HIGH (ref 70–99)
Glucose-Capillary: 131 mg/dL — ABNORMAL HIGH (ref 70–99)

## 2013-06-06 MED ORDER — OMEPRAZOLE 40 MG PO CPDR: 40.0000 mg | DELAYED_RELEASE_CAPSULE | Freq: Every day | ORAL | Status: DC

## 2013-06-06 MED ORDER — SODIUM CHLORIDE 0.9 % IV SOLN: 500.0000 mL | INTRAVENOUS | Status: DC

## 2013-06-06 NOTE — Progress Notes (Addendum)
Pt bp was elevated upon arrival bp was 206/111, had pt lay down for a few minutes rechecked bp 198/104- CRNA advised

## 2013-06-06 NOTE — Progress Notes (Signed)
Patient did not have preoperative order for IV antibiotic SSI prophylaxis. (G8918)  Patient did not experience any of the following events: a burn prior to discharge; a fall within the facility; wrong site/side/patient/procedure/implant event; or a hospital transfer or hospital admission upon discharge from the facility. (G8907)  

## 2013-06-06 NOTE — Progress Notes (Signed)
Called to room to assist during endoscopic procedure.  Patient ID and intended procedure confirmed with present staff. Received instructions for my participation in the procedure from the performing physician. ewm 

## 2013-06-06 NOTE — Patient Instructions (Addendum)

## 2013-06-06 NOTE — Op Note (Signed)
[facility] [address] [place], [address]   ENDOSCOPY PROCEDURE REPORT  PATIENT: Saroya, Riccobono  MR#: [id number] BIRTHDATE: [birth year] , 56  yrs. old GENDER: Female ENDOSCOPIST: Roxy Cedar, MD REFERRED BY:  .  Self / Office PROCEDURE DATE:  06/06/2013 PROCEDURE:  EGD w/ biopsy ASA CLASS:     Class II INDICATIONS:  Dysphagia. MEDICATIONS: MAC sedation, administered by CRNA and propofol (Diprivan) 200mg  IV TOPICAL ANESTHETIC: none  DESCRIPTION OF PROCEDURE: After the risks benefits and alternatives of the procedure were thoroughly explained, informed consent was obtained.  The LB [id number] endoscope was introduced through the mouth and advanced to the second portion of the duodenum. Without limitations.  The instrument was slowly withdrawn as the mucosa was fully examined.      EXAM: Normal esophagus.  No stricture.  The stomach revealed an 8mm antral ulcer and a few erosions.  CLO bx taken.  Normal duodenum. Retroflexed views revealed no abnormalities.     The scope was then withdrawn from the patient and the procedure completed.  COMPLICATIONS: There were no complications. ENDOSCOPIC IMPRESSION: 1.Gastric antral ulcer and a few erosions.  CLO bx taken. 2. Normal exam otherwise  RECOMMENDATIONS: 1.  Prescribe omeprazole 40 mg PO q am; #30; 3 refills.  Take for 8 weeks 2.  Avoid NSAIDS , if using 3.  Rx CLO if positive  REPEAT EXAM:  eSigned:  Roxy Cedar, MD 06/06/2013 8:45 AM   KG:[id number] Minerva Ends, MD and The Patient

## 2013-06-06 NOTE — Op Note (Addendum)
[facility] [address] [place], [address]   COLONOSCOPY PROCEDURE REPORT  PATIENT: Hannah, Parsons  MR#: [id number] BIRTHDATE: 1956-04-17 , 56  yrs. old GENDER: Female ENDOSCOPIST: Roxy Cedar, MD REFERRED XB:[id number] Program Recall PROCEDURE DATE:  06/06/2013 PROCEDURE:   Colonoscopy, surveillance . First Screening Colonoscopy - Avg.  risk and is 50 yrs.  old or older - No.  Prior Negative Screening - Now for repeat screening. N/A  History of Adenoma - Now for follow-up colonoscopy & has been > or = to 3 yrs.  Yes hx of adenoma.  Has been 3 or more years since last colonoscopy.  Polyps Removed Today? No.  Recommend repeat exam, <10 yrs? Yes.  High risk (family or personal hx). ASA CLASS:   Class II INDICATIONS:Patient's personal history of adenomatous colon polyps. Index exam 11-2008 (5 adenomas) MEDICATIONS: MAC sedation, administered by CRNA and propofol (Diprivan) 300mg  IV  DESCRIPTION OF PROCEDURE:   After the risks benefits and alternatives of the procedure were thoroughly explained, informed consent was obtained.  A digital rectal exam revealed no abnormalities of the rectum.   The LB [id number]  endoscope was introduced through the anus and advanced to the cecum, which was identified by both the appendix and ileocecal valve. No adverse events experienced.   The quality of the prep was good, using MoviPrep  The instrument was then slowly withdrawn as the colon was fully examined.   COLON FINDINGS: Mild diverticulosis was noted The finding was in the left colon.   The colon was otherwise normal.  There was no inflammation, polyps or cancers .  Retroflexed views revealed internal hemorrhoids. The time to cecum=1 minutes 05 seconds. Withdrawal time=7 minutes 54 seconds.  The scope was withdrawn and the procedure completed.  COMPLICATIONS: There were no complications.  ENDOSCOPIC IMPRESSION: 1.   Mild diverticulosis was noted in the  left colon 2.   The colon was otherwise normal  RECOMMENDATIONS: 1. Follow up colonoscopy in 5 years (Hx multiple adenomas) 2. EGD today (see report)   eSigned:  Roxy Cedar, MD 06/06/2013 8:37 AM Revised: 06/06/2013 8:37 AM  cc: Pecola Lawless, MD and The Patient   PATIENT NAME:  Hannah, Parsons MR#: [id number]

## 2013-06-07 ENCOUNTER — Telehealth: Payer: Self-pay | Admitting: *Deleted

## 2013-06-07 LAB — HELICOBACTER PYLORI SCREEN-BIOPSY: UREASE: NEGATIVE

## 2013-06-07 NOTE — Telephone Encounter (Signed)

## 2013-08-01 ENCOUNTER — Ambulatory Visit (INDEPENDENT_AMBULATORY_CARE_PROVIDER_SITE_OTHER): Payer: BC Managed Care – PPO | Admitting: Cardiovascular Disease

## 2013-08-01 ENCOUNTER — Encounter: Payer: Self-pay | Admitting: Cardiovascular Disease

## 2013-08-01 VITALS — BP 140/92 | HR 69 | Ht 66.5 in | Wt 282.0 lb

## 2013-08-01 DIAGNOSIS — I10 Essential (primary) hypertension: Secondary | ICD-10-CM

## 2013-08-01 MED ORDER — AMLODIPINE BESYLATE 5 MG PO TABS: 5.0000 mg | ORAL_TABLET | Freq: Every day | ORAL | Status: DC
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
## 2013-08-01 NOTE — Patient Instructions (Addendum)
Your physician wants you to follow-up in:  4 months.  You will receive a reminder letter in the mail two months in advance. If you don't receive a letter, please call our office to schedule the follow-up appointment.  Your physician has requested that you have an echocardiogram. Echocardiography is a painless test that uses sound waves to create images of your heart. It provides your doctor with information about the size and shape of your heart and how well your heart's chambers and valves are working. This procedure takes approximately one hour. There are no restrictions for this procedure.  Your physician has recommended you make the following change in your medication:  Stop Coreg. Start Norvasc 5 mg by mouth daily

## 2013-08-17 ENCOUNTER — Ambulatory Visit (HOSPITAL_COMMUNITY): Payer: BC Managed Care – PPO | Attending: Cardiology

## 2013-08-17 ENCOUNTER — Other Ambulatory Visit (HOSPITAL_COMMUNITY): Payer: Self-pay | Admitting: Cardiovascular Disease

## 2013-08-17 DIAGNOSIS — E119 Type 2 diabetes mellitus without complications: Secondary | ICD-10-CM | POA: Insufficient documentation

## 2013-08-17 DIAGNOSIS — I10 Essential (primary) hypertension: Secondary | ICD-10-CM

## 2013-08-17 DIAGNOSIS — E785 Hyperlipidemia, unspecified: Secondary | ICD-10-CM | POA: Insufficient documentation

## 2013-08-17 DIAGNOSIS — I517 Cardiomegaly: Secondary | ICD-10-CM

## 2013-08-17 DIAGNOSIS — I379 Nonrheumatic pulmonary valve disorder, unspecified: Secondary | ICD-10-CM | POA: Insufficient documentation

## 2013-08-17 NOTE — Progress Notes (Signed)
Echocardiogram performed.  

## 2013-08-21 ENCOUNTER — Telehealth: Payer: Self-pay | Admitting: Cardiovascular Disease

## 2013-08-22 ENCOUNTER — Encounter: Payer: Self-pay | Admitting: Cardiovascular Disease

## 2013-09-07 ENCOUNTER — Other Ambulatory Visit: Payer: Self-pay

## 2013-09-23 ENCOUNTER — Other Ambulatory Visit: Payer: Self-pay | Admitting: Internal Medicine

## 2013-10-17 LAB — HM DIABETES EYE EXAM

## 2013-11-07 ENCOUNTER — Ambulatory Visit (INDEPENDENT_AMBULATORY_CARE_PROVIDER_SITE_OTHER): Payer: BC Managed Care – PPO | Admitting: Cardiovascular Disease

## 2013-11-07 ENCOUNTER — Encounter: Payer: Self-pay | Admitting: Cardiovascular Disease

## 2013-11-07 VITALS — BP 136/88 | HR 74 | Ht 66.5 in | Wt 294.0 lb

## 2013-11-07 DIAGNOSIS — I10 Essential (primary) hypertension: Secondary | ICD-10-CM

## 2013-11-07 MED ORDER — DILTIAZEM HCL ER COATED BEADS 120 MG PO CP24: 120.0000 mg | ORAL_CAPSULE | Freq: Every day | ORAL | Status: DC
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
## 2013-11-07 NOTE — Patient Instructions (Signed)
Your physician wants you to follow-up in:  6 months. You will receive a reminder letter in the mail two months in advance. If you don't receive a letter, please call our office to schedule the follow-up appointment.  Your physician has recommended you make the following change in your medication:  Stop Norvasc. Start Cardizem CD 120 mg by mouth daily

## 2013-11-14 ENCOUNTER — Ambulatory Visit: Payer: BC Managed Care – PPO | Admitting: Cardiovascular Disease

## 2013-11-17 ENCOUNTER — Other Ambulatory Visit: Payer: Self-pay | Admitting: *Deleted

## 2013-11-17 ENCOUNTER — Telehealth: Payer: Self-pay | Admitting: *Deleted

## 2013-11-17 DIAGNOSIS — E119 Type 2 diabetes mellitus without complications: Secondary | ICD-10-CM

## 2013-11-17 DIAGNOSIS — E785 Hyperlipidemia, unspecified: Secondary | ICD-10-CM

## 2013-11-17 NOTE — Telephone Encounter (Signed)
Lab orders for Lipid Panel and Hemoglobin A1c entered into Epic. Ok to schedule appt. JG//CMA

## 2013-11-17 NOTE — Telephone Encounter (Signed)
Patient called and stated that it was time for her to have her 58month labs done. Please advise

## 2013-11-28 ENCOUNTER — Other Ambulatory Visit (INDEPENDENT_AMBULATORY_CARE_PROVIDER_SITE_OTHER): Payer: BC Managed Care – PPO

## 2013-11-28 DIAGNOSIS — E119 Type 2 diabetes mellitus without complications: Secondary | ICD-10-CM

## 2013-11-28 DIAGNOSIS — E039 Hypothyroidism, unspecified: Secondary | ICD-10-CM

## 2013-11-28 DIAGNOSIS — E785 Hyperlipidemia, unspecified: Secondary | ICD-10-CM

## 2013-11-28 LAB — HEMOGLOBIN A1C: HEMOGLOBIN A1C: 7 % — AB (ref 4.6–6.5)

## 2013-11-28 LAB — LIPID PANEL
CHOL/HDL RATIO: 5
Cholesterol: 175 mg/dL (ref 0–200)
HDL: 37.8 mg/dL — ABNORMAL LOW (ref >39.00)
TRIGLYCERIDES: 216 mg/dL — AB (ref 0.0–149.0)
VLDL: 43.2 mg/dL — ABNORMAL HIGH (ref 0.0–40.0)

## 2013-11-28 LAB — TSH: TSH: 0.84 u[IU]/mL (ref 0.35–5.50)

## 2013-11-28 LAB — LDL CHOLESTEROL, DIRECT: Direct LDL: 107.9 mg/dL

## 2013-12-01 ENCOUNTER — Ambulatory Visit (INDEPENDENT_AMBULATORY_CARE_PROVIDER_SITE_OTHER): Payer: BC Managed Care – PPO | Admitting: Internal Medicine

## 2013-12-01 ENCOUNTER — Encounter: Payer: Self-pay | Admitting: Internal Medicine

## 2013-12-01 VITALS — BP 156/74 | HR 76 | Temp 98.4°F | Wt 290.4 lb

## 2013-12-01 DIAGNOSIS — E119 Type 2 diabetes mellitus without complications: Secondary | ICD-10-CM

## 2013-12-01 DIAGNOSIS — E785 Hyperlipidemia, unspecified: Secondary | ICD-10-CM

## 2013-12-01 DIAGNOSIS — E039 Hypothyroidism, unspecified: Secondary | ICD-10-CM

## 2013-12-01 DIAGNOSIS — Z86010 Personal history of colonic polyps: Secondary | ICD-10-CM

## 2013-12-01 DIAGNOSIS — I10 Essential (primary) hypertension: Secondary | ICD-10-CM

## 2013-12-01 MED ORDER — RAMIPRIL 5 MG PO CAPS: ORAL_CAPSULE | ORAL | Status: DC

## 2013-12-01 MED ORDER — LEVOTHYROXINE SODIUM 25 MCG PO TABS: ORAL_TABLET | ORAL | Status: DC

## 2013-12-01 MED ORDER — METFORMIN HCL ER 500 MG PO TB24
500.0000 mg | ORAL_TABLET | Freq: Every day | ORAL | Status: DC
Start: 2013-12-01 — End: 2014-05-27

## 2013-12-01 MED ORDER — LEVOTHYROXINE SODIUM 150 MCG PO TABS: 150.0000 ug | ORAL_TABLET | Freq: Every day | ORAL | Status: DC

## 2013-12-01 NOTE — Assessment & Plan Note (Signed)
Verify BP reliability

## 2013-12-01 NOTE — Progress Notes (Signed)
Pre visit review using our clinic review tool, if applicable. No additional management support is needed unless otherwise documented below in the visit note. 

## 2013-12-01 NOTE — Assessment & Plan Note (Signed)
No change in meds; A1c 4 mos with urine microalbumin With TLC A1c & LDL should improve

## 2013-12-01 NOTE — Assessment & Plan Note (Signed)
TLC rather than statin

## 2013-12-01 NOTE — Patient Instructions (Signed)

## 2013-12-01 NOTE — Progress Notes (Signed)
   Subjective:    Patient ID: Hannah Parsons, female    DOB: 1956-02-27, 58 y.o.   MRN: [id number]  HPI HYPERTENSION: Disease Monitoring: Blood pressure average-126/70 Medication Compliance-yes  FASTING HYPERGLYCEMIA OR Diabetes :  FBS average- 114 2 hr post meal glucose- < 180 Medication compliance- yes Hypoglycemia-no  HYPERLIPIDEMIA: Disease Monitoring: See symptoms for Hypertension Medication Compliance- no statin  Since 11/02/13 she has been walking 20 minutes a day. Her diet was not is good last fall as her husband was being treated for coronary disease and had bypass surgery. Her triglycerides rose from 189 up to 216. As expected the HDL dropped; is 37.8.The LDL was minimally elevated at 107.9.    A1c is 7% which correlates to an average sugar 172 and risk 40%  Her TSH is excellent at 0.84.    Review of Systems  Chest pain, palpitations-  no      Dyspnea- no Edema- no Lightheadedness,Syncope- no Polyuria/phagia/dipsia-      no Blurred vision /diplopia/lossof vision-no Limb numbness/tingling/burning-no Non healing skin lesions-no Abd pain, bowel changes- no Muscle aches- no Mdemory loss-no       Objective:   Physical Exam Appears healthy and well-nourished & in no acute distress  No carotid bruits are present.No neck pain distention present at 10 - 15 degrees. Thyroid normal to palpation  Heart rhythm and rate are normal with no significant murmurs or gallops.  Chest is clear with no increased work of breathing  There is no evidence of aortic aneurysm or renal artery bruits  Abdomen soft with no organomegaly or masses. No HJR  No clubbing, cyanosis or edema present.  Pedal pulses are intact   No ischemic skin changes are present . Nails healthy    Alert and oriented. Strength, tone, DTRs reflexes normal          Assessment & Plan:  See Current Assessment & Plan in Problem List under specific Diagnosis

## 2013-12-01 NOTE — Assessment & Plan Note (Signed)
No change in dose 

## 2013-12-05 ENCOUNTER — Telehealth: Payer: Self-pay

## 2013-12-05 NOTE — Telephone Encounter (Signed)
Relevant patient education assigned to patient using Emmi. ° °

## 2014-01-24 ENCOUNTER — Telehealth: Payer: Self-pay

## 2014-01-24 ENCOUNTER — Other Ambulatory Visit: Payer: Self-pay | Admitting: Internal Medicine

## 2014-01-24 MED ORDER — VALACYCLOVIR HCL 1 G PO TABS: 1000.0000 mg | ORAL_TABLET | Freq: Two times a day (BID) | ORAL | Status: DC

## 2014-01-24 NOTE — Telephone Encounter (Signed)
Sent to her [facility]

## 2014-01-24 NOTE — Telephone Encounter (Signed)
The patient called and is needing an rx of Valtrex called into a local pharmacy.  She did not specifically say which pharmacy she needed.  She became upset during the call when I mentioned I did not see this request that she states she made a week ago.     Callback - [phone]

## 2014-01-24 NOTE — Telephone Encounter (Signed)
Spoke with the pt and she stated that she has used the Valtrex years ago.  Explained to the pt that Valtrex is not on her med list but acyclovir is.  She stated she was sorry.  Pt stated she would like a refill on the acyclovir for fever blister.  It has been over 5 years since she has had it refilled.  Please advise.//AB/CMA

## 2014-02-07 ENCOUNTER — Other Ambulatory Visit: Payer: Self-pay | Admitting: Internal Medicine

## 2014-03-03 ENCOUNTER — Other Ambulatory Visit: Payer: Self-pay | Admitting: Internal Medicine

## 2014-05-09 ENCOUNTER — Ambulatory Visit (INDEPENDENT_AMBULATORY_CARE_PROVIDER_SITE_OTHER): Payer: BC Managed Care – PPO | Admitting: Cardiovascular Disease

## 2014-05-09 ENCOUNTER — Encounter: Payer: Self-pay | Admitting: Cardiovascular Disease

## 2014-05-09 VITALS — BP 142/90 | HR 64 | Ht 66.5 in | Wt 294.0 lb

## 2014-05-09 DIAGNOSIS — I10 Essential (primary) hypertension: Secondary | ICD-10-CM

## 2014-05-09 NOTE — Patient Instructions (Signed)
Your physician wants you to follow-up in:  12 months.  You will receive a reminder letter in the mail two months in advance. If you don't receive a letter, please call our office to schedule the follow-up appointment.   
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
## 2014-05-27 ENCOUNTER — Other Ambulatory Visit: Payer: Self-pay | Admitting: Internal Medicine

## 2014-06-25 ENCOUNTER — Telehealth: Payer: Self-pay | Admitting: Internal Medicine

## 2014-06-26 NOTE — Telephone Encounter (Signed)
Pt states she has been having diarrhea and loose stools for 10days. States she was seen at an urgent care for the diarrhea and yeast in her mouth. Pt states she has not been on any antibiotics and they tested her for cdiff at Urgent care and it was negative. Pt also states she did have a fever when the diarrhea started. Offered pt an appt with Dr. Henrene Pastor 06/27/14@9 :30am but pt states she cannot come due to work. Requests an early am appt with midlevel. Pt scheduled to see Alonza Bogus PA 07/12/14@8 :30am. Pt aware of appt.

## 2014-06-27 ENCOUNTER — Ambulatory Visit: Payer: BC Managed Care – PPO | Admitting: Internal Medicine

## 2014-07-06 ENCOUNTER — Telehealth: Payer: Self-pay | Admitting: Internal Medicine

## 2014-07-06 ENCOUNTER — Other Ambulatory Visit (INDEPENDENT_AMBULATORY_CARE_PROVIDER_SITE_OTHER): Payer: BC Managed Care – PPO

## 2014-07-06 ENCOUNTER — Encounter: Payer: Self-pay | Admitting: Gastroenterology

## 2014-07-06 ENCOUNTER — Other Ambulatory Visit: Payer: BC Managed Care – PPO

## 2014-07-06 ENCOUNTER — Ambulatory Visit (INDEPENDENT_AMBULATORY_CARE_PROVIDER_SITE_OTHER): Payer: BC Managed Care – PPO | Admitting: Gastroenterology

## 2014-07-06 VITALS — BP 142/82 | HR 64 | Ht 66.0 in | Wt 289.4 lb

## 2014-07-06 DIAGNOSIS — R5383 Other fatigue: Secondary | ICD-10-CM

## 2014-07-06 DIAGNOSIS — R5381 Other malaise: Secondary | ICD-10-CM

## 2014-07-06 DIAGNOSIS — R197 Diarrhea, unspecified: Secondary | ICD-10-CM

## 2014-07-06 LAB — CBC WITH DIFFERENTIAL/PLATELET
Basophils Absolute: 0 10*3/uL (ref 0.0–0.1)
Basophils Relative: 0.6 % (ref 0.0–3.0)
EOS PCT: 1.6 % (ref 0.0–5.0)
Eosinophils Absolute: 0.1 10*3/uL (ref 0.0–0.7)
HEMATOCRIT: 39.7 % (ref 36.0–46.0)
HEMOGLOBIN: 13.3 g/dL (ref 12.0–15.0)
Lymphocytes Relative: 27.5 % (ref 12.0–46.0)
Lymphs Abs: 2 10*3/uL (ref 0.7–4.0)
MCHC: 33.5 g/dL (ref 30.0–36.0)
MCV: 88.7 fl (ref 78.0–100.0)
MONOS PCT: 10.1 % (ref 3.0–12.0)
Monocytes Absolute: 0.7 10*3/uL (ref 0.1–1.0)
NEUTROS ABS: 4.3 10*3/uL (ref 1.4–7.7)
Neutrophils Relative %: 60.2 % (ref 43.0–77.0)
Platelets: 284 10*3/uL (ref 150.0–400.0)
RBC: 4.48 Mil/uL (ref 3.87–5.11)
RDW: 14.5 % (ref 11.5–15.5)
WBC: 7.1 10*3/uL (ref 4.0–10.5)

## 2014-07-06 LAB — BASIC METABOLIC PANEL
BUN: 12 mg/dL (ref 6–23)
CALCIUM: 9.1 mg/dL (ref 8.4–10.5)
CO2: 23 meq/L (ref 19–32)
CREATININE: 0.7 mg/dL (ref 0.4–1.2)
Chloride: 106 mEq/L (ref 96–112)
GFR: 85.41 mL/min (ref >60.00)
GLUCOSE: 109 mg/dL — AB (ref 70–99)
Potassium: 3.7 mEq/L (ref 3.5–5.1)
Sodium: 136 mEq/L (ref 135–145)

## 2014-07-06 MED ORDER — SACCHAROMYCES BOULARDII 250 MG PO CAPS: 250.0000 mg | ORAL_CAPSULE | Freq: Two times a day (BID) | ORAL | Status: DC

## 2014-07-06 MED ORDER — METRONIDAZOLE 500 MG PO TABS: 500.0000 mg | ORAL_TABLET | Freq: Three times a day (TID) | ORAL | Status: DC

## 2014-07-06 NOTE — Telephone Encounter (Signed)
Pt states her diarrhea is worse and would like to be seen sooner. Pt scheduled to see Alonza Bogus PA today at 2:30pm. Pt aware of appt.

## 2014-07-06 NOTE — Patient Instructions (Signed)
Your physician has requested that you go to the basement for the following lab work before leaving today: GI PATHOGEN PANEL  OVA AND PARASITE  CBC BMP  We have sent the following medications to your pharmacy for you to pick up at your convenience: Flagyl 500 mg, please take one tablet by mouth three times a day for ten days  Florastor, take twice daily
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
## 2014-07-07 NOTE — Progress Notes (Signed)
Agree with initial assessment and plans 

## 2014-07-10 ENCOUNTER — Telehealth: Payer: Self-pay | Admitting: Gastroenterology

## 2014-07-10 LAB — GASTROINTESTINAL PATHOGEN PANEL PCR
C. difficile Tox A/B, PCR: NEGATIVE
Campylobacter, PCR: POSITIVE — CR
Cryptosporidium, PCR: NEGATIVE
E coli (ETEC) LT/ST PCR: NEGATIVE
E coli (STEC) stx1/stx2, PCR: NEGATIVE
E coli 0157, PCR: NEGATIVE
GIARDIA LAMBLIA, PCR: NEGATIVE
Norovirus, PCR: NEGATIVE
ROTAVIRUS, PCR: NEGATIVE
SALMONELLA, PCR: NEGATIVE
SHIGELLA, PCR: NEGATIVE

## 2014-07-10 LAB — OVA AND PARASITE EXAMINATION: OP: NONE SEEN

## 2014-07-10 NOTE — Telephone Encounter (Signed)
Calling for test results.Please, advise.

## 2014-07-10 NOTE — Telephone Encounter (Signed)
Please let patient know that labs are normal.  Stool studies still pending.  Thank you,  Jess

## 2014-07-11 ENCOUNTER — Other Ambulatory Visit: Payer: Self-pay | Admitting: *Deleted

## 2014-07-11 MED ORDER — AZITHROMYCIN 500 MG PO TABS: 500.0000 mg | ORAL_TABLET | Freq: Every day | ORAL | Status: DC

## 2014-07-11 NOTE — Telephone Encounter (Signed)
Patient notified of results.

## 2014-07-11 NOTE — Telephone Encounter (Signed)
Left a message for patient to call back. 

## 2014-07-12 ENCOUNTER — Ambulatory Visit: Payer: BC Managed Care – PPO | Admitting: Gastroenterology

## 2014-07-21 ENCOUNTER — Other Ambulatory Visit: Payer: Self-pay | Admitting: Internal Medicine

## 2014-08-09 ENCOUNTER — Other Ambulatory Visit: Payer: Self-pay

## 2014-08-09 DIAGNOSIS — Z1231 Encounter for screening mammogram for malignant neoplasm of breast: Secondary | ICD-10-CM

## 2014-08-15 ENCOUNTER — Ambulatory Visit: Payer: BC Managed Care – PPO | Admitting: Internal Medicine

## 2014-08-16 ENCOUNTER — Telehealth: Payer: Self-pay | Admitting: Internal Medicine

## 2014-08-16 NOTE — Telephone Encounter (Signed)
Pt is a previous [facility] patient and would like to establish with Dr Maudie Mercury. Will you except her.

## 2014-08-16 NOTE — Telephone Encounter (Signed)
ok 

## 2014-08-17 NOTE — Telephone Encounter (Signed)
Pt has been scheduled.  °

## 2014-08-21 ENCOUNTER — Ambulatory Visit: Payer: BC Managed Care – PPO

## 2014-09-03 ENCOUNTER — Ambulatory Visit
Admission: RE | Admit: 2014-09-03 | Discharge: 2014-09-03 | Disposition: A | Discharge status: Home / Self Care | Payer: BC Managed Care – PPO | Source: Ambulatory Visit

## 2014-09-03 DIAGNOSIS — Z1231 Encounter for screening mammogram for malignant neoplasm of breast: Secondary | ICD-10-CM

## 2014-09-20 ENCOUNTER — Ambulatory Visit (INDEPENDENT_AMBULATORY_CARE_PROVIDER_SITE_OTHER): Payer: BC Managed Care – PPO | Admitting: Physician Assistant

## 2014-09-20 ENCOUNTER — Other Ambulatory Visit: Payer: BC Managed Care – PPO

## 2014-09-20 ENCOUNTER — Other Ambulatory Visit (INDEPENDENT_AMBULATORY_CARE_PROVIDER_SITE_OTHER): Payer: BC Managed Care – PPO

## 2014-09-20 ENCOUNTER — Encounter: Payer: Self-pay | Admitting: Physician Assistant

## 2014-09-20 ENCOUNTER — Telehealth: Payer: Self-pay | Admitting: Internal Medicine

## 2014-09-20 VITALS — BP 150/100 | HR 76 | Ht 66.0 in | Wt 293.2 lb

## 2014-09-20 DIAGNOSIS — R110 Nausea: Secondary | ICD-10-CM

## 2014-09-20 DIAGNOSIS — R197 Diarrhea, unspecified: Secondary | ICD-10-CM

## 2014-09-20 LAB — CBC WITH DIFFERENTIAL/PLATELET
BASOS PCT: 0.3 % (ref 0.0–3.0)
Basophils Absolute: 0 10*3/uL (ref 0.0–0.1)
EOS PCT: 0.6 % (ref 0.0–5.0)
Eosinophils Absolute: 0 10*3/uL (ref 0.0–0.7)
HEMATOCRIT: 42.2 % (ref 36.0–46.0)
HEMOGLOBIN: 13.8 g/dL (ref 12.0–15.0)
Lymphocytes Relative: 17.2 % (ref 12.0–46.0)
Lymphs Abs: 1.1 10*3/uL (ref 0.7–4.0)
MCHC: 32.8 g/dL (ref 30.0–36.0)
MCV: 88.9 fl (ref 78.0–100.0)
MONO ABS: 0.5 10*3/uL (ref 0.1–1.0)
MONOS PCT: 8.5 % (ref 3.0–12.0)
NEUTROS ABS: 4.6 10*3/uL (ref 1.4–7.7)
Neutrophils Relative %: 73.4 % (ref 43.0–77.0)
PLATELETS: 215 10*3/uL (ref 150.0–400.0)
RBC: 4.74 Mil/uL (ref 3.87–5.11)
RDW: 14.2 % (ref 11.5–15.5)
WBC: 6.3 10*3/uL (ref 4.0–10.5)

## 2014-09-20 LAB — BASIC METABOLIC PANEL
BUN: 15 mg/dL (ref 6–23)
CALCIUM: 9.3 mg/dL (ref 8.4–10.5)
CO2: 22 mEq/L (ref 19–32)
Chloride: 104 mEq/L (ref 96–112)
Creatinine, Ser: 0.6 mg/dL (ref 0.4–1.2)
GFR: 100.91 mL/min (ref >60.00)
GLUCOSE: 122 mg/dL — AB (ref 70–99)
Potassium: 4 mEq/L (ref 3.5–5.1)
Sodium: 135 mEq/L (ref 135–145)

## 2014-09-20 MED ORDER — METRONIDAZOLE 250 MG PO TABS: 250.0000 mg | ORAL_TABLET | Freq: Four times a day (QID) | ORAL | Status: AC

## 2014-09-20 NOTE — Progress Notes (Signed)
Agree with initial assessment and plans as outlined 

## 2014-09-20 NOTE — Patient Instructions (Signed)
Please go to the basement level to have your labs drawn and stool study. We sent a prescription for Flagyl 250 mg , to [facility] [address] and [place] Continue the Florastor probiotic 1 cap twice daily.  Push fluids. Soft bland diet. Follow up as needed with Dr. Henrene Pastor or Nicoletta Ba PA.

## 2014-09-20 NOTE — Telephone Encounter (Signed)
Pt states she has been having watery explosive diarrhea and chills just like she did when she had Campylobacter. Pt requests to be seen. Pt scheduled to see Nicoletta Ba PA today at 2pm. Pt aware of appt.

## 2014-09-20 NOTE — Progress Notes (Signed)
Patient ID: Sammuel Hines [place], female   DOB: May 15, 1956, 58 y.o.   MRN: [id number]   Subjective:    Patient ID: Sammuel Hines [place]akins, female    DOB: 1956-03-03, 58 y.o.   MRN: [id number]  HPI Bayli is a pleasant 58 year old white female known to Dr. Henrene Pastor. She has history of hypertension, prior gastric ulcer, diabetes mellitus, obesity, hypothyroidism and prior history of adenomatous colon polyps. Her last colonoscopy was done in August 2014 and showed only mild diverticulosis. She was seen here in early September with an acute diarrheal illness which lasted for several weeks and was associated with malaise. She was initially treated empirically with Flagyl and then had a GI pathogen panel done which returned positive for Campylobacter. She also took a three-day course of a Z-Pak. She says she completed both antibiotics and finally got the feeling back to her self several weeks later. About 6 weeks ago she took a five-day course of amoxicillin around the time for root canal, has not had any other antibiotics. She had acute onset yesterday around noon with queasiness followed by projectile diarrhea. She says she had multiple episodes of diarrhea last night. She felt extremely fatigued and achy also had chills but no documented fever. She says she slept for about 11 hours but did get up to 6 times during the night with diarrhea. This  morning she has a headache and some nausea, no vomiting, has been trying to keep down liquids. She continues to have watery malodorous stool which is nonbloody. She is quite concerned about relapse of the Campylobacter or any other prolonged diarrheal illness. She has continued on floor store twice daily since that illness. No other new meds, no known exposures.  Review of Systems  Outpatient Encounter Prescriptions as of 09/20/2014  Medication Sig  . acyclovir (ZOVIRAX) 400 MG tablet Take 500 mg by mouth daily as needed. For fever blisters   . b complex vitamins capsule Take 1  capsule by mouth daily.  . Cholecalciferol (VITAMIN D3) 2000 UNITS TABS Take 2,000 Units by mouth daily.    . Cinnamon 500 MG capsule Take by mouth.  . diltiazem (CARDIZEM CD) 120 MG 24 hr capsule Take 1 capsule (120 mg total) by mouth daily.  Marland Kitchen glucose blood (ONE TOUCH ULTRA TEST) test strip 1 each by Other route. Check blood sugar once daily DX: 250.00   . levothyroxine (SYNTHROID, LEVOTHROID) 150 MCG tablet TAKE 1 TABLET (150 MCG TOTAL) BY MOUTH DAILY.  Marland Kitchen levothyroxine (SYNTHROID, LEVOTHROID) 25 MCG tablet TAKE 1/2 TABLET DAILY  . Melatonin 5 MG TABS Take 5 mg by mouth at bedtime.    . metFORMIN (GLUCOPHAGE-XR) 500 MG 24 hr tablet TAKE 1 TABLET (500 MG TOTAL) BY MOUTH DAILY WITH BREAKFAST.  Marland Kitchen omeprazole (PRILOSEC) 40 MG capsule TAKE ONE CAPSULE EVERY DAY  . ONETOUCH DELICA LANCETS MISC by Does not apply route. Check blood sugar daily DX:250.00   . Polyethylene Glycol 3350 (MIRALAX PO) Take 17 g by mouth daily.   . Probiotic Product (PROBIOTIC DAILY PO) Take 1 capsule by mouth daily before breakfast.  . ramipril (ALTACE) 5 MG capsule TAKE 1 CAPSULE DAILY  . saccharomyces boulardii (FLORASTOR) 250 MG capsule Take 1 capsule (250 mg total) by mouth 2 (two) times daily.  . [DISCONTINUED] azithromycin (ZITHROMAX) 500 MG tablet Take 1 tablet (500 mg total) by mouth daily.  . [DISCONTINUED] metroNIDAZOLE (FLAGYL) 500 MG tablet Take 1 tablet (500 mg total) by mouth 3 (three) times daily.  . metroNIDAZOLE (  FLAGYL) 250 MG tablet Take 1 tablet (250 mg total) by mouth 4 (four) times daily.   Allergies  Allergen Reactions  . Codeine Sulfate     REACTION: hives  . Erythromycin Base     REACTION: stomach cramps, flu-like symptoms  . Losartan Potassium     REACTION: PATIENT DIDNT FEEL WELL WHILE TAKING; intercostal chest pain, fatigue  . Meperidine Hcl     REACTION: vomiting   Patient Active Problem List   Diagnosis Date Noted  . Diarrhea 07/06/2014  . Other malaise and fatigue 07/06/2014  .  Hypertrophy of uterus 12/08/2010  . COLONIC POLYPS, HX OF 11/21/2009  . DIAB W/O COMP TYPE II/UNS NOT STATED UNCNTRL 02/20/2009  . CHOLELITHIASIS 02/20/2009  . NEPHROLITHIASIS, HX OF 02/20/2009  . HYPOTHYROIDISM 08/31/2007  . HYPERLIPIDEMIA 08/31/2007  . HYPERTENSION 08/31/2007   History   Social History  . Marital Status: Married    Spouse Name: N/A    Number of Children: 1  . Years of Education: N/A   Occupational History  . [employer]    Social History Main Topics  . Smoking status: Never Smoker   . Smokeless tobacco: Never Used  . Alcohol Use: 0.5 oz/week    1 drink(s) per week     Comment: Rarely  . Drug Use: No  . Sexual Activity: Yes    Birth Control/ Protection: None   Other Topics Concern  . Not on file   Social History Narrative    Ms. Barsky's family history includes Colon cancer (age of onset: 61) in her maternal grandfather; Esophageal cancer in her maternal grandmother; Heart failure in her father and paternal grandmother; Hypertension in an other family member; Lymphoma in her mother; Lymphoma (age of onset: 32) in her maternal grandfather. There is no history of Diabetes, Stroke, Heart attack, or CAD.      Objective:    Filed Vitals:   09/20/14 1359  BP: 150/100  Pulse: 76    Physical Exam  well-developed white female in no acute distress, pleasant blood pressure 150/100 pulse 76 height 5 foot 6 weight 293, BMI 47. HEENT ;nontraumatic, normocephalic EOMI PERRLA sclera anicteric, Supple; no JVD, Cardiovascular ;regular rate and rhythm with S1-S2 no murmur or gallop, Pulmonary; clear bilaterally, Abdomen; large soft she has mild rather diffuse tenderness no guarding or rebound no palpable mass or hepatosplenomegaly bowel sounds are active, Rectal; exam not done, Ext; no clubbing cyanosis or edema skin warm and dry, Psych; mood and affect appropriate       Assessment & Plan:   #70  58 year old female with an acute diarrheal illness 24 hours  associated with nausea and fatigue as well as headache. Rule out viral gastroenteritis rule out possible C. Difficile #2 history of acute infectious colitis September 2015 secondary to Campylobacter jejuni #3 remote history of adenomatous colon polyps last colonoscopy August 2014 negative #4diverticulosis #5 adult-onset diabetes mellitus #6 hypertension #7 hypothyroidism  Plan; She is encouraged to push fluids and soft bland diet over the next few days Check CBC with differential and BMETtoday GI pathogen panel Start empiric course of metronidazole 250 mg by mouth 4 times daily 14 days Continue Florastor  twice daily Further plans and follow up pending results of labs and stool studies  Amy S Esterwood PA-C 09/20/2014

## 2014-09-21 LAB — GASTROINTESTINAL PATHOGEN PANEL PCR
C. difficile Tox A/B, PCR: NEGATIVE
CRYPTOSPORIDIUM, PCR: NEGATIVE
Campylobacter, PCR: NEGATIVE
E coli (ETEC) LT/ST PCR: NEGATIVE
E coli (STEC) stx1/stx2, PCR: NEGATIVE
E coli 0157, PCR: NEGATIVE
GIARDIA LAMBLIA, PCR: NEGATIVE
NOROVIRUS, PCR: NEGATIVE
Rotavirus A, PCR: NEGATIVE
SALMONELLA, PCR: NEGATIVE
SHIGELLA, PCR: NEGATIVE

## 2014-09-24 ENCOUNTER — Telehealth: Payer: Self-pay

## 2014-09-24 NOTE — Telephone Encounter (Signed)
Pt aware and states she is feeling better, Friday was just a bad day she states.

## 2014-09-24 NOTE — Telephone Encounter (Signed)
-----   Message from Alfredia Ferguson, PA-C sent at 09/24/2014  4:14 PM EST ----- Please let pt know the Gi pathogen panel is negative- hopefully she is feeling better- let me know how she is feeling

## 2014-09-24 NOTE — Telephone Encounter (Signed)
Ok, great.

## 2014-09-28 ENCOUNTER — Other Ambulatory Visit: Payer: Self-pay | Admitting: Gastroenterology

## 2014-10-15 ENCOUNTER — Ambulatory Visit: Payer: BC Managed Care – PPO | Admitting: Internal Medicine

## 2014-10-24 ENCOUNTER — Encounter: Payer: Self-pay | Admitting: Family Medicine

## 2014-10-24 ENCOUNTER — Ambulatory Visit (INDEPENDENT_AMBULATORY_CARE_PROVIDER_SITE_OTHER): Payer: BC Managed Care – PPO | Admitting: Family Medicine

## 2014-10-24 VITALS — BP 132/88 | HR 75 | Temp 98.5°F | Ht 66.0 in | Wt 300.2 lb

## 2014-10-24 DIAGNOSIS — I10 Essential (primary) hypertension: Secondary | ICD-10-CM

## 2014-10-24 DIAGNOSIS — E785 Hyperlipidemia, unspecified: Secondary | ICD-10-CM

## 2014-10-24 DIAGNOSIS — E119 Type 2 diabetes mellitus without complications: Secondary | ICD-10-CM

## 2014-10-24 DIAGNOSIS — E038 Other specified hypothyroidism: Secondary | ICD-10-CM

## 2014-10-24 LAB — LIPID PANEL
CHOL/HDL RATIO: 5
CHOLESTEROL: 187 mg/dL (ref 0–200)
HDL: 35 mg/dL — ABNORMAL LOW (ref >39.00)
LDL CALC: 119 mg/dL — AB (ref 0–99)
NonHDL: 152
Triglycerides: 163 mg/dL — ABNORMAL HIGH (ref 0.0–149.0)
VLDL: 32.6 mg/dL (ref 0.0–40.0)

## 2014-10-24 LAB — BASIC METABOLIC PANEL
BUN: 14 mg/dL (ref 6–23)
CHLORIDE: 102 meq/L (ref 96–112)
CO2: 26 mEq/L (ref 19–32)
CREATININE: 0.6 mg/dL (ref 0.4–1.2)
Calcium: 9.3 mg/dL (ref 8.4–10.5)
GFR: 102.73 mL/min (ref >60.00)
Glucose, Bld: 165 mg/dL — ABNORMAL HIGH (ref 70–99)
Potassium: 4.4 mEq/L (ref 3.5–5.1)
Sodium: 136 mEq/L (ref 135–145)

## 2014-10-24 LAB — HEMOGLOBIN A1C: Hgb A1c MFr Bld: 7.7 % — ABNORMAL HIGH (ref 4.6–6.5)

## 2014-10-24 LAB — TSH: TSH: 3.27 u[IU]/mL (ref 0.35–4.50)

## 2014-10-24 MED ORDER — METFORMIN HCL 1000 MG PO TABS: 1000.0000 mg | ORAL_TABLET | Freq: Every day | ORAL | Status: DC

## 2014-10-24 MED ORDER — ONETOUCH DELICA LANCETS 33G MISC: Status: DC

## 2014-10-24 MED ORDER — ACYCLOVIR 400 MG PO TABS: 500.0000 mg | ORAL_TABLET | Freq: Every day | ORAL | Status: DC | PRN

## 2014-10-24 MED ORDER — RAMIPRIL 5 MG PO CAPS: 5.0000 mg | ORAL_CAPSULE | Freq: Every day | ORAL | Status: DC

## 2014-10-24 MED ORDER — OMEPRAZOLE 40 MG PO CPDR: 40.0000 mg | DELAYED_RELEASE_CAPSULE | Freq: Every day | ORAL | Status: DC

## 2014-10-24 MED ORDER — GLUCOSE BLOOD VI STRP: ORAL_STRIP | Status: DC

## 2014-10-24 MED ORDER — DILTIAZEM HCL ER COATED BEADS 120 MG PO CP24: 120.0000 mg | ORAL_CAPSULE | Freq: Every day | ORAL | Status: DC
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
## 2014-10-24 NOTE — Progress Notes (Signed)
Pre visit review using our clinic review tool, if applicable. No additional management support is needed unless otherwise documented below in the visit note. 

## 2014-10-24 NOTE — Addendum Note (Signed)
Addended by: Agnes Lawrence on: 10/24/2014 04:28 PM   Modules accepted: Orders

## 2014-10-24 NOTE — Patient Instructions (Addendum)
BEFORE YOU LEAVE: -labs -schedule follow up in 4 months  -We have ordered labs or studies at this visit. It can take up to 1-2 weeks for results and processing. We will contact you with instructions IF your results are abnormal. Normal results will be released to your [facility]. If you have not heard from [place] or can not find your results in [facility] in 2 weeks please contact our office.  -PLEASE SIGN UP FOR MYCHART TODAY   We recommend the following healthy lifestyle measures: - eat a healthy diet consisting of lots of vegetables, fruits, beans, nuts, seeds, healthy meats such as white chicken and fish and whole grains. Try to avoid carbs if possible. - avoid fried foods, fast food, processed foods, sodas, red meet and other fattening foods.  - get a least 150 minutes of aerobic exercise per week.

## 2014-10-25 ENCOUNTER — Telehealth: Payer: Self-pay | Admitting: Family Medicine

## 2014-10-25 ENCOUNTER — Other Ambulatory Visit: Payer: Self-pay | Admitting: *Deleted

## 2014-10-25 DIAGNOSIS — E038 Other specified hypothyroidism: Secondary | ICD-10-CM

## 2014-10-25 MED ORDER — LEVOTHYROXINE SODIUM 150 MCG PO TABS: 150.0000 ug | ORAL_TABLET | Freq: Every day | ORAL | Status: DC

## 2014-10-25 MED ORDER — METFORMIN HCL 1000 MG PO TABS: 1000.0000 mg | ORAL_TABLET | Freq: Every day | ORAL | Status: DC

## 2014-10-25 MED ORDER — LEVOTHYROXINE SODIUM 25 MCG PO TABS: 25.0000 ug | ORAL_TABLET | Freq: Every day | ORAL | Status: DC

## 2014-10-25 NOTE — Telephone Encounter (Signed)
Rx done. 

## 2014-10-25 NOTE — Telephone Encounter (Signed)
Pt is waiting on mailorder and needs metformin 1000 mg #[facility] [email or web address]

## 2014-10-25 NOTE — Addendum Note (Signed)
Addended by: Agnes Lawrence on: 10/25/2014 10:52 AM   Modules accepted: Orders

## 2014-10-31 ENCOUNTER — Other Ambulatory Visit: Payer: Self-pay | Admitting: Cardiovascular Disease

## 2014-10-31 DIAGNOSIS — I10 Essential (primary) hypertension: Secondary | ICD-10-CM

## 2014-11-16 ENCOUNTER — Other Ambulatory Visit: Payer: Self-pay | Admitting: Internal Medicine

## 2014-11-16 ENCOUNTER — Other Ambulatory Visit: Payer: Self-pay | Admitting: Family Medicine

## 2015-01-30 ENCOUNTER — Encounter: Payer: Self-pay | Admitting: Family Medicine

## 2015-01-31 ENCOUNTER — Other Ambulatory Visit: Payer: Self-pay | Admitting: *Deleted

## 2015-01-31 DIAGNOSIS — E785 Hyperlipidemia, unspecified: Secondary | ICD-10-CM

## 2015-01-31 DIAGNOSIS — E118 Type 2 diabetes mellitus with unspecified complications: Secondary | ICD-10-CM

## 2015-01-31 DIAGNOSIS — E038 Other specified hypothyroidism: Secondary | ICD-10-CM

## 2015-01-31 DIAGNOSIS — I10 Essential (primary) hypertension: Secondary | ICD-10-CM

## 2015-02-04 ENCOUNTER — Other Ambulatory Visit (INDEPENDENT_AMBULATORY_CARE_PROVIDER_SITE_OTHER): Payer: BLUE CROSS/BLUE SHIELD

## 2015-02-04 DIAGNOSIS — E785 Hyperlipidemia, unspecified: Secondary | ICD-10-CM | POA: Diagnosis not present

## 2015-02-04 DIAGNOSIS — I10 Essential (primary) hypertension: Secondary | ICD-10-CM

## 2015-02-04 DIAGNOSIS — E118 Type 2 diabetes mellitus with unspecified complications: Secondary | ICD-10-CM

## 2015-02-04 DIAGNOSIS — E038 Other specified hypothyroidism: Secondary | ICD-10-CM

## 2015-02-04 LAB — LDL CHOLESTEROL, DIRECT: Direct LDL: 109 mg/dL

## 2015-02-04 LAB — BASIC METABOLIC PANEL
BUN: 15 mg/dL (ref 6–23)
CALCIUM: 9.7 mg/dL (ref 8.4–10.5)
CHLORIDE: 101 meq/L (ref 96–112)
CO2: 26 meq/L (ref 19–32)
Creatinine, Ser: 0.64 mg/dL (ref 0.40–1.20)
GFR: 100.78 mL/min (ref >60.00)
Glucose, Bld: 149 mg/dL — ABNORMAL HIGH (ref 70–99)
Potassium: 4.2 mEq/L (ref 3.5–5.1)
Sodium: 135 mEq/L (ref 135–145)

## 2015-02-04 LAB — LIPID PANEL
Cholesterol: 168 mg/dL (ref 0–200)
HDL: 39 mg/dL — ABNORMAL LOW (ref >39.00)
NonHDL: 129
Total CHOL/HDL Ratio: 4
Triglycerides: 235 mg/dL — ABNORMAL HIGH (ref 0.0–149.0)
VLDL: 47 mg/dL — ABNORMAL HIGH (ref 0.0–40.0)

## 2015-02-04 LAB — HEMOGLOBIN A1C: HEMOGLOBIN A1C: 7.2 % — AB (ref 4.6–6.5)

## 2015-02-04 LAB — TSH: TSH: 5.44 u[IU]/mL — ABNORMAL HIGH (ref 0.35–4.50)

## 2015-02-06 ENCOUNTER — Ambulatory Visit (INDEPENDENT_AMBULATORY_CARE_PROVIDER_SITE_OTHER): Payer: BLUE CROSS/BLUE SHIELD | Admitting: Family Medicine

## 2015-02-06 ENCOUNTER — Encounter: Payer: Self-pay | Admitting: Family Medicine

## 2015-02-06 ENCOUNTER — Telehealth: Payer: Self-pay | Admitting: Family Medicine

## 2015-02-06 VITALS — BP 138/88 | HR 87 | Temp 98.3°F | Ht 66.0 in | Wt 298.9 lb

## 2015-02-06 DIAGNOSIS — E785 Hyperlipidemia, unspecified: Secondary | ICD-10-CM

## 2015-02-06 DIAGNOSIS — E119 Type 2 diabetes mellitus without complications: Secondary | ICD-10-CM | POA: Diagnosis not present

## 2015-02-06 DIAGNOSIS — I10 Essential (primary) hypertension: Secondary | ICD-10-CM | POA: Diagnosis not present

## 2015-02-06 DIAGNOSIS — E038 Other specified hypothyroidism: Secondary | ICD-10-CM | POA: Diagnosis not present

## 2015-02-06 MED ORDER — LEVOTHYROXINE SODIUM 200 MCG PO TABS: 200.0000 ug | ORAL_TABLET | Freq: Every day | ORAL | Status: DC

## 2015-02-06 MED ORDER — METFORMIN HCL 1000 MG PO TABS: 1000.0000 mg | ORAL_TABLET | Freq: Two times a day (BID) | ORAL | Status: DC
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
## 2015-02-06 NOTE — Telephone Encounter (Signed)
emmi emailed °

## 2015-02-06 NOTE — Progress Notes (Signed)
Pre visit review using our clinic review tool, if applicable. No additional management support is needed unless otherwise documented below in the visit note. 

## 2015-02-06 NOTE — Patient Instructions (Signed)
BEFORE YOU LEAVE: -lab appointment to check thyroid in 6 weeks -follow up appointment with Dr. Maudie Mercury in 3 months - come fasting, drink plenty of water  Increase thyroid medication to 251mcg daily  Increase metformin to 1000mg  in the morning and 1000mg  in the evening with meals  At least 150-300 minutes of cardio exercise per week  Healthy low carb or no carb diet with minimal sweets

## 2015-02-26 LAB — HM DIABETES EYE EXAM

## 2015-02-28 NOTE — Telephone Encounter (Signed)
error:[id number] ° °

## 2015-03-27 ENCOUNTER — Other Ambulatory Visit (INDEPENDENT_AMBULATORY_CARE_PROVIDER_SITE_OTHER): Payer: BLUE CROSS/BLUE SHIELD

## 2015-03-27 DIAGNOSIS — E038 Other specified hypothyroidism: Secondary | ICD-10-CM | POA: Diagnosis not present

## 2015-03-27 LAB — TSH: TSH: 2.38 u[IU]/mL (ref 0.35–4.50)

## 2015-04-08 ENCOUNTER — Ambulatory Visit (INDEPENDENT_AMBULATORY_CARE_PROVIDER_SITE_OTHER): Payer: BLUE CROSS/BLUE SHIELD | Admitting: Family Medicine

## 2015-04-08 ENCOUNTER — Ambulatory Visit (INDEPENDENT_AMBULATORY_CARE_PROVIDER_SITE_OTHER)
Admission: RE | Admit: 2015-04-08 | Discharge: 2015-04-08 | Disposition: A | Discharge status: Home / Self Care | Payer: BLUE CROSS/BLUE SHIELD | Source: Ambulatory Visit | Attending: Family Medicine | Admitting: Family Medicine

## 2015-04-08 ENCOUNTER — Encounter: Payer: Self-pay | Admitting: Family Medicine

## 2015-04-08 VITALS — BP 142/88 | HR 102 | Temp 97.7°F | Ht 66.0 in | Wt 294.8 lb

## 2015-04-08 DIAGNOSIS — J988 Other specified respiratory disorders: Secondary | ICD-10-CM

## 2015-04-08 DIAGNOSIS — R042 Hemoptysis: Secondary | ICD-10-CM

## 2015-04-08 DIAGNOSIS — R05 Cough: Secondary | ICD-10-CM

## 2015-04-08 DIAGNOSIS — R059 Cough, unspecified: Secondary | ICD-10-CM

## 2015-04-08 DIAGNOSIS — R062 Wheezing: Secondary | ICD-10-CM

## 2015-04-08 DIAGNOSIS — E119 Type 2 diabetes mellitus without complications: Secondary | ICD-10-CM

## 2015-04-08 MED ORDER — PREDNISONE 20 MG PO TABS: ORAL_TABLET | ORAL | Status: DC
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
## 2015-04-08 NOTE — Progress Notes (Signed)
Pre visit review using our clinic review tool, if applicable. No additional management support is needed unless otherwise documented below in the visit note. 

## 2015-04-08 NOTE — Patient Instructions (Addendum)
BEFORE YOU LEAVE: -xray sheet -labs  Start the prednisone, continue the Augmentin

## 2015-04-09 ENCOUNTER — Telehealth: Payer: Self-pay | Admitting: Family Medicine

## 2015-04-09 LAB — D-DIMER, QUANTITATIVE: D-Dimer, Quant: 0.41 ug/mL-FEU (ref 0.00–0.48)

## 2015-04-09 LAB — CBC
HCT: 37.3 % (ref 36.0–46.0)
Hemoglobin: 12.2 g/dL (ref 12.0–15.0)
MCHC: 32.8 g/dL (ref 30.0–36.0)
MCV: 89.3 fl (ref 78.0–100.0)
Platelets: 233 10*3/uL (ref 150.0–400.0)
RBC: 4.18 Mil/uL (ref 3.87–5.11)
RDW: 15.7 % — ABNORMAL HIGH (ref 11.5–15.5)
WBC: 5.1 10*3/uL (ref 4.0–10.5)

## 2015-04-09 LAB — BASIC METABOLIC PANEL
BUN: 10 mg/dL (ref 6–23)
CHLORIDE: 103 meq/L (ref 96–112)
CO2: 24 meq/L (ref 19–32)
Calcium: 9.4 mg/dL (ref 8.4–10.5)
Creatinine, Ser: 0.7 mg/dL (ref 0.40–1.20)
GFR: 90.83 mL/min (ref >60.00)
GLUCOSE: 144 mg/dL — AB (ref 70–99)
POTASSIUM: 4.2 meq/L (ref 3.5–5.1)
Sodium: 137 mEq/L (ref 135–145)

## 2015-04-09 MED ORDER — ALBUTEROL SULFATE (2.5 MG/3ML) 0.083% IN NEBU: 2.5000 mg | INHALATION_SOLUTION | Freq: Four times a day (QID) | RESPIRATORY_TRACT | Status: DC | PRN

## 2015-04-09 MED ORDER — ALBUTEROL SULFATE HFA 108 (90 BASE) MCG/ACT IN AERS: 2.0000 | INHALATION_SPRAY | Freq: Every day | RESPIRATORY_TRACT | Status: DC | PRN

## 2015-04-09 NOTE — Telephone Encounter (Signed)
Pt was seen yesterday and needs xray results and blood work

## 2015-04-09 NOTE — Telephone Encounter (Signed)
Patient informed of the message and states she called and scheduled an appt with Dr Melvyn Novas this Thursday.

## 2015-04-09 NOTE — Telephone Encounter (Signed)
Dr Kim-I called the pt and informed her of the message below and she states she is feeling some better; however she states she had a severe coughing spell last night and had to give herself a breathing treatment and needs a refill on the nebulizer treatment (Albuterol inh solution 0.083%) and also a rescue inhaler to have with her during the day in case this happens again.

## 2015-04-09 NOTE — Telephone Encounter (Signed)
Ok to refill - please send for her- but if needing frequently, not improving or not responding to the treatments please advise her to call so that we can get her in with the pulmonologist. Please schedule follow up next week. Thanks.

## 2015-04-09 NOTE — Telephone Encounter (Signed)
Please let her know - xray showed pneumonia and bronchitis - if feeling better would continue with plan for Augmentin and prednisone. If not doing better need urgent referral to pulmonology. If struggling to breath or worsening needs to go to hospital. Some labs still pending but other results ok. Please let her know I was going to call her over lunch - hoping all labs were back by then - but go ahead and call her this morning if you get a chance.

## 2015-04-09 NOTE — Telephone Encounter (Signed)
Rxs done. Left a message for the pt to return my call.

## 2015-04-11 ENCOUNTER — Ambulatory Visit (INDEPENDENT_AMBULATORY_CARE_PROVIDER_SITE_OTHER): Payer: BLUE CROSS/BLUE SHIELD | Admitting: Internal Medicine

## 2015-04-11 ENCOUNTER — Encounter: Payer: Self-pay | Admitting: Internal Medicine

## 2015-04-11 VITALS — BP 158/90 | HR 86 | Ht 66.5 in | Wt 290.8 lb

## 2015-04-11 DIAGNOSIS — J189 Pneumonia, unspecified organism: Secondary | ICD-10-CM | POA: Diagnosis not present

## 2015-04-11 DIAGNOSIS — R05 Cough: Secondary | ICD-10-CM | POA: Diagnosis not present

## 2015-04-11 DIAGNOSIS — I10 Essential (primary) hypertension: Secondary | ICD-10-CM | POA: Diagnosis not present

## 2015-04-11 DIAGNOSIS — R058 Other specified cough: Secondary | ICD-10-CM

## 2015-04-11 MED ORDER — MOMETASONE FURO-FORMOTEROL FUM 100-5 MCG/ACT IN AERO: INHALATION_SPRAY | RESPIRATORY_TRACT | Status: DC

## 2015-04-11 MED ORDER — VALSARTAN 160 MG PO TABS: 160.0000 mg | ORAL_TABLET | Freq: Every day | ORAL | Status: DC
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
## 2015-04-11 NOTE — Patient Instructions (Signed)
Prilosec 40 Take 30- 60 min before your first and last meals of the day   GERD (REFLUX)  is an extremely common cause of respiratory symptoms just like yours , many times with no obvious heartburn at all.    It can be treated with medication, but also with lifestyle changes including avoidance of late meals, elevation of the head of your bed (ideally with 6 inch  bed blocks) excessive alcohol, smoking cessation, and avoid fatty foods, chocolate, peppermint, colas, red wine, and acidic juices such as orange juice.  NO MINT OR MENTHOL PRODUCTS SO NO COUGH DROPS  USE SUGARLESS CANDY INSTEAD (Jolley ranchers or Stover's or Life Savers) or even ice chips will also do - the key is to swallow to prevent all throat clearing. NO OIL BASED VITAMINS - use powdered substitutes.  Stop ramapril   Diovan  160 mg one daily   Finish prednisone and augmentin   dulera 100 Take 2 puffs first thing in am and then another 2 puffs about 12 hours later.   Only use your albuterol as a rescue medication to be used if you can't catch your breath or stop coughing  by resting  .  - The less you use it, the better it will work when you need it. - Ok to use up to 2 puffs  every 4 hours if you must but call for immediate appointment if use goes up over your usual need - Don't leave home without it !!  (think of it like the spare tire for your car)    Please schedule a follow up office visit in 2 weeks, sooner if needed with cxr on return

## 2015-04-12 ENCOUNTER — Encounter: Payer: Self-pay | Admitting: Internal Medicine

## 2015-04-12 DIAGNOSIS — R05 Cough: Secondary | ICD-10-CM | POA: Insufficient documentation

## 2015-04-12 DIAGNOSIS — R058 Other specified cough: Secondary | ICD-10-CM | POA: Insufficient documentation

## 2015-04-12 DIAGNOSIS — J189 Pneumonia, unspecified organism: Secondary | ICD-10-CM | POA: Insufficient documentation

## 2015-04-12 NOTE — Assessment & Plan Note (Signed)
Occurred in setting of likely viral uri/ already approp rx by Dr Maudie Mercury, needs f/u p completes abx > scheduled

## 2015-04-12 NOTE — Assessment & Plan Note (Addendum)
The most common causes of chronic cough in immunocompetent adults include the following: upper airway cough syndrome (UACS), previously referred to as postnasal drip syndrome (PNDS), which is caused by variety of rhinosinus conditions; (2) asthma; (3) GERD; (4) chronic bronchitis from cigarette smoking or other inhaled environmental irritants; (5) nonasthmatic eosinophilic bronchitis; and (6) bronchiectasis.   These conditions, singly or in combination, have accounted for up to 94% of the causes of chronic cough in prospective studies.   Other conditions have constituted no >6% of the causes in prospective studies These have included bronchogenic carcinoma, chronic interstitial pneumonia, sarcoidosis, left ventricular failure, ACEI-induced cough, and aspiration from a condition associated with pharyngeal dysfunction.    Chronic cough is often simultaneously caused by more than one condition. A single cause has been found from 38 to 82% of the time, multiple causes from 18 to 62%. Multiply caused cough has been the result of three diseases up to 42% of the time.       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   These are the same pts (now being labeled as having "irritable larynx syndrome" by some cough centers) who not infrequently have a history of having failed to tolerate ace inhibitors,  dry powder inhalers or biphosphonates or report having atypical reflux symptoms that don't respond to standard doses of PPI ,  and are easily confused as having aecopd or asthma flares by even experienced allergists/ pulmonologists.   The first step is to maximize acid suppression and eliminate acei  And rx with dulera 100 to cover the possibility of cough variant asthma   See instructions for specific recommendations which were reviewed directly with the patient who was given a copy with highlighter outlining the key components.

## 2015-04-12 NOTE — Assessment & Plan Note (Signed)

## 2015-04-17 ENCOUNTER — Encounter: Payer: Self-pay | Admitting: Family Medicine

## 2015-04-19 ENCOUNTER — Telehealth: Payer: Self-pay | Admitting: Family Medicine

## 2015-04-19 NOTE — Telephone Encounter (Signed)
I called the pt and advised her per Dr Maudie Mercury she is sorry she is not feeling well but there is not much she can do for this in the office as she should go to the ER.  Advised the pt pancreatitis can be very severe and she should not sit at home with the symptoms she is having and she recommended she try going to [facility] as they are a lot faster than the standard ER and they provide good care too.  Patient agreed and she was informed of their address.

## 2015-04-19 NOTE — Telephone Encounter (Signed)
[facility] - Client [facility] Patient Name: Hannah Parsons DOB: 1956/01/16 Initial Comment caller states she has sx of pancreatitis, upper left quad paain Nurse Assessment Nurse: Erlene Quan, RN, Manuela Schwartz Date/Time Eilene Ghazi Time): 04/19/2015 1:05:15 PM Confirm and document reason for call. If symptomatic, describe symptoms. 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 the Tetracycline she has been on along with all the NSAIDs she has been taking can cause this and she never knew it but her symptoms started after her first round of antibiotics - she feels sure without a doubt she has pancreatitis, she knows not much to do but modify her diet but she does need to know for sure if that is what this is - states she is having moderate to severe pain - and is going to have to have something done - states she has been in to see Dr Maudie Mercury 4 times in last month and she really does not feel she needs to come in again - she would like to ask if Dr Maudie Mercury will order her a scan and let her get that done and then if she wants her to follow up with her after wars she will be glad to but she is really for sure that she knows what this is , states please convey that I am not trying to skirt around my doctor but I have enough sense to know if I come is she is going to tell me I need a scan - states she will be more then happy to talk to Dr Maudie Mercury over the phone Has the patient traveled out of the country within the last 30 days? ---Not Applicable Does the patient require triage? ---Declined Triage Please  document clinical information provided and list any resource used. ---Advised caller I will have to send a message to the office a let them know that she is not wanting an appointment she is wanting to either talk to Dr Maudie Mercury or have her send orders for her a scan - advised her with the symptoms she is describing , severe pain L upper abdomen - she may really need to be seen in the ER , she states she is not going to ER forget it - Advised I will fax message to the office and she will get a return call from them - seh verbalized understanding Guidelines Guideline Title Affirmed Question Affirmed Notes Final Disposition User Clinical Call Erlene Quan, RN, Manuela Schwartz

## 2015-04-20 ENCOUNTER — Ambulatory Visit (INDEPENDENT_AMBULATORY_CARE_PROVIDER_SITE_OTHER): Payer: BLUE CROSS/BLUE SHIELD | Admitting: Family Medicine

## 2015-04-20 ENCOUNTER — Other Ambulatory Visit (HOSPITAL_COMMUNITY)
Admission: RE | Admit: 2015-04-20 | Discharge: 2015-04-20 | Disposition: A | Discharge status: Home / Self Care | Payer: BLUE CROSS/BLUE SHIELD | Source: Other Acute Inpatient Hospital | Attending: Family Medicine | Admitting: Family Medicine

## 2015-04-20 ENCOUNTER — Encounter: Payer: Self-pay | Admitting: Family Medicine

## 2015-04-20 VITALS — BP 134/80 | HR 80 | Temp 98.1°F | Ht 66.5 in | Wt 290.2 lb

## 2015-04-20 DIAGNOSIS — R1012 Left upper quadrant pain: Secondary | ICD-10-CM | POA: Insufficient documentation

## 2015-04-20 DIAGNOSIS — R1013 Epigastric pain: Secondary | ICD-10-CM | POA: Diagnosis present

## 2015-04-20 LAB — COMPREHENSIVE METABOLIC PANEL
ALK PHOS: 83 U/L (ref 38–126)
ALT: 47 U/L (ref 14–54)
ANION GAP: 8 (ref 5–15)
AST: 31 U/L (ref 15–41)
Albumin: 4.1 g/dL (ref 3.5–5.0)
BILIRUBIN TOTAL: 0.4 mg/dL (ref 0.3–1.2)
BUN: 9 mg/dL (ref 6–20)
CO2: 26 mmol/L (ref 22–32)
Calcium: 9.1 mg/dL (ref 8.9–10.3)
Chloride: 102 mmol/L (ref 101–111)
Creatinine, Ser: 0.75 mg/dL (ref 0.44–1.00)
GFR calc Af Amer: >60 mL/min (ref >60)
GLUCOSE: 194 mg/dL — AB (ref 65–99)
POTASSIUM: 4.2 mmol/L (ref 3.5–5.1)
SODIUM: 136 mmol/L (ref 135–145)
TOTAL PROTEIN: 7.1 g/dL (ref 6.5–8.1)

## 2015-04-20 LAB — LIPASE, BLOOD: Lipase: 21 U/L — ABNORMAL LOW (ref 22–51)

## 2015-04-20 NOTE — Progress Notes (Signed)
Pre visit review using our clinic review tool, if applicable. No additional management support is needed unless otherwise documented below in the visit note. 

## 2015-04-20 NOTE — Assessment & Plan Note (Signed)
Most likely due to chest wall strain from frequent coughing. Pain is improved today.  pt remains concerned about pancreatitis... Will eval with CMET and lipase.

## 2015-04-20 NOTE — Patient Instructions (Addendum)
Stop at lab at [facility] long. We will call with lab results. Rest and push fluids.  Gentle stretching of chest wall if labs normal.

## 2015-04-20 NOTE — Progress Notes (Signed)
   Subjective:    Patient ID: Hannah Parsons, female    DOB: 05-23-1956, 59 y.o.   MRN: [id number]  HPI  59 year old female pt with recent history of CAP dx on 04/08/2015, followed by Dr. Wende Mott. s/p course of  Antibiotics ( doxy and augmentin)  presents with abdominal pain that may be due to recent excessive coughing.  During PNA she had pain circular across upper abdomen. Stopped coughing now but still 1 week of intermittant with severe pain in left upper quadrant of abdomen. Had radiation of pain to central back.  No nausea, no vomiting, no fever. NO blood stool. No D/C. She has decreased appetitie since she was sick.  No change with eating.  No heartburn, no acid in stomach. Recent increased PPI to higher dose.  Pain has improved told, no current pain.   Social History /Family History/Past Medical History reviewed and updated if needed.  Review of Systems  Constitutional: Negative for fever and fatigue.  HENT: Negative for ear pain.   Eyes: Negative for pain.  Respiratory: Negative for chest tightness and shortness of breath.   Cardiovascular: Negative for chest pain, palpitations and leg swelling.  Gastrointestinal: Negative for abdominal pain.  Genitourinary: Negative for dysuria.       Objective:   Physical Exam  Constitutional: Vital signs are normal. She appears well-developed and well-nourished. She is cooperative.  Non-toxic appearance. She does not appear ill. No distress.  HENT:  Head: Normocephalic.  Right Ear: Hearing, tympanic membrane, external ear and ear canal normal. Tympanic membrane is not erythematous, not retracted and not bulging.  Left Ear: Hearing, tympanic membrane, external ear and ear canal normal. Tympanic membrane is not erythematous, not retracted and not bulging.  Nose: No mucosal edema or rhinorrhea. Right sinus exhibits no maxillary sinus tenderness and no frontal sinus tenderness. Left sinus exhibits no maxillary sinus tenderness and no  frontal sinus tenderness.  Mouth/Throat: Uvula is midline, oropharynx is clear and moist and mucous membranes are normal.  Eyes: Conjunctivae, EOM and lids are normal. Pupils are equal, round, and reactive to light. Lids are everted and swept, no foreign bodies found.  Neck: Trachea normal and normal range of motion. Neck supple. Carotid bruit is not present. No thyroid mass and no thyromegaly present.  Cardiovascular: Normal rate, regular rhythm, S1 normal, S2 normal, normal heart sounds, intact distal pulses and normal pulses.  Exam reveals no gallop and no friction rub.   No murmur heard. Pulmonary/Chest: Effort normal and breath sounds normal. No tachypnea. No respiratory distress. She has no decreased breath sounds. She has no wheezes. She has no rhonchi. She has no rales.  Abdominal: Soft. Normal appearance and bowel sounds are normal. There is tenderness in the left upper quadrant.    Mildly sore to touch, non focal to rib  Neurological: She is alert.  Skin: Skin is warm, dry and intact. No rash noted.  Psychiatric: Her speech is normal and behavior is normal. Judgment and thought content normal. Her mood appears not anxious. Cognition and memory are normal. She does not exhibit a depressed mood.          Assessment & Plan:

## 2015-04-22 ENCOUNTER — Telehealth: Payer: Self-pay | Admitting: *Deleted

## 2015-04-22 NOTE — Telephone Encounter (Signed)
Advised RN to call this pt and get in for appt on 6/15 at initial call. Called pt  6/17 and advised ED eval.

## 2015-04-22 NOTE — Telephone Encounter (Signed)
Patient Name: ALEXYA Matheney Gender: Female DOB: 1956/05/31 Age: 59 Y 28 M 5 D Return Phone Number: [phone] (Primary) Address: City/State/Zip: [place] Client [facility] - Client Client Site [facility] - Day Physician Colin Benton Contact Type Call Call Type Triage / Clinical Relationship To Patient Self Appointment Disposition EMR Patient Refused Appointment Info pasted into Epic Yes Return Phone Number [phone] (Primary) Chief Complaint Abdominal Pain Initial Comment caller states she has sx of pancreatitis, upper left quad paain Nurse Assessment Nurse: Erlene Quan, RN, Manuela Schwartz Date/Time Eilene Ghazi Time): 04/19/2015 1:05:15 PM Confirm and document reason for call. If symptomatic, describe symptoms. 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 the Tetracycline she has been on along with all the NSAIDs she has been taking can cause this and she never knew it but her symptoms started after her first round of antibiotics - she feels sure without a doubt she has pancreatitis, she knows not much to do but modify her diet but she does need to know for sure if that is what this is - states she is having moderate to severe pain - and is going to have to have something done - states she has been in to see Dr Maudie Mercury 4 times in last month and she really does not feel she needs to come in again - she would like to ask if Dr Maudie Mercury will order her a scan and let her get that done and then if she wants her to follow up with her after wars she will be glad to but she is really for sure that she knows what this is , states  please convey that I am not trying to skirt around my doctor but I have enough sense to know if I come is she is going to tell me I need a scan - states she will be more then happy to talk to Dr Maudie Mercury over the phone Has the patient traveled out of the country within the last 30 days? ---Not Applicable Does the patient require triage? ---Declined Triage Please document clinical information provided and list any resource used. ---Advised

## 2015-04-25 ENCOUNTER — Ambulatory Visit (INDEPENDENT_AMBULATORY_CARE_PROVIDER_SITE_OTHER): Payer: BLUE CROSS/BLUE SHIELD | Admitting: Internal Medicine

## 2015-04-25 ENCOUNTER — Encounter: Payer: Self-pay | Admitting: Internal Medicine

## 2015-04-25 ENCOUNTER — Ambulatory Visit (INDEPENDENT_AMBULATORY_CARE_PROVIDER_SITE_OTHER)
Admission: RE | Admit: 2015-04-25 | Discharge: 2015-04-25 | Disposition: A | Discharge status: Home / Self Care | Payer: BLUE CROSS/BLUE SHIELD | Source: Ambulatory Visit | Attending: Internal Medicine | Admitting: Internal Medicine

## 2015-04-25 VITALS — BP 132/80 | HR 84 | Ht 66.5 in | Wt 293.0 lb

## 2015-04-25 DIAGNOSIS — J189 Pneumonia, unspecified organism: Secondary | ICD-10-CM | POA: Diagnosis not present

## 2015-04-25 DIAGNOSIS — R05 Cough: Secondary | ICD-10-CM

## 2015-04-25 DIAGNOSIS — R058 Other specified cough: Secondary | ICD-10-CM

## 2015-04-25 DIAGNOSIS — I10 Essential (primary) hypertension: Secondary | ICD-10-CM | POA: Diagnosis not present

## 2015-04-25 MED ORDER — OMEPRAZOLE 40 MG PO CPDR: 40.0000 mg | DELAYED_RELEASE_CAPSULE | Freq: Two times a day (BID) | ORAL | Status: DC

## 2015-04-25 NOTE — Patient Instructions (Addendum)
Try to reduce the dulera 100 = one twice daily - increase back to 2 every 12 hours if getting more short of breath- rinse and gargle p use  For nasal congestion/ drainage/>> Zyrtec is more effective but more drying > allegra and clariton are less effective but less drying   Please schedule a follow up office visit in 4 weeks, sooner if needed - call for sinus CT [id number] [phone] if loosing ground
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
## 2015-04-28 ENCOUNTER — Encounter: Payer: Self-pay | Admitting: Internal Medicine

## 2015-04-28 NOTE — Assessment & Plan Note (Signed)
See cxr 04/08/15 > marked improvement but needs f/u cxr to complete the loop in 4 weeks

## 2015-04-28 NOTE — Assessment & Plan Note (Signed)
Body mass index is 46.59 kg/(m^2).  Lab Results  Component Value Date   TSH 2.38 03/27/2015     Contributing to gerd tendency/ doe/ needs to achieve and maintain neg calorie balance >f/u primary care

## 2015-04-28 NOTE — Assessment & Plan Note (Signed)
Trial off altace 04/12/2015 due to cough/ pseudowheeze > resolved  The proof that the acei was contributing will be if her med list is vastly shortened as a result of stopping it  For now,  Adequate control on present rx, reviewed > no change in rx needed

## 2015-04-28 NOTE — Assessment & Plan Note (Signed)
Much better, no need for cough meds/ should be able to work on shortening her med list - start by reducing dulera to 100 one bid   I had an extended discussion with the patient reviewing all relevant studies completed to date and  lasting 15 to 20 minutes of a 25 minute visit    Each maintenance medication was reviewed in detail including most importantly the difference between maintenance and prns and under what circumstances the prns are to be triggered using an action plan format that is not reflected in the computer generated alphabetically organized AVS.    Please see instructions for details which were reviewed in writing and the patient given a copy highlighting the part that I personally wrote and discussed at today's ov.

## 2015-04-29 ENCOUNTER — Other Ambulatory Visit: Payer: Self-pay

## 2015-04-30 ENCOUNTER — Encounter: Payer: Self-pay | Admitting: Internal Medicine

## 2015-05-08 ENCOUNTER — Other Ambulatory Visit: Payer: BLUE CROSS/BLUE SHIELD

## 2015-05-09 ENCOUNTER — Encounter: Payer: Self-pay | Admitting: Family Medicine

## 2015-05-09 ENCOUNTER — Ambulatory Visit (INDEPENDENT_AMBULATORY_CARE_PROVIDER_SITE_OTHER): Payer: BLUE CROSS/BLUE SHIELD | Admitting: Family Medicine

## 2015-05-09 VITALS — BP 118/84 | HR 90 | Temp 98.1°F | Ht 66.5 in | Wt 294.8 lb

## 2015-05-09 DIAGNOSIS — I10 Essential (primary) hypertension: Secondary | ICD-10-CM | POA: Diagnosis not present

## 2015-05-09 DIAGNOSIS — B370 Candidal stomatitis: Secondary | ICD-10-CM

## 2015-05-09 DIAGNOSIS — E119 Type 2 diabetes mellitus without complications: Secondary | ICD-10-CM | POA: Diagnosis not present

## 2015-05-09 DIAGNOSIS — E785 Hyperlipidemia, unspecified: Secondary | ICD-10-CM | POA: Diagnosis not present

## 2015-05-09 DIAGNOSIS — E038 Other specified hypothyroidism: Secondary | ICD-10-CM

## 2015-05-09 MED ORDER — FLUCONAZOLE 150 MG PO TABS: 150.0000 mg | ORAL_TABLET | Freq: Once | ORAL | Status: DC

## 2015-05-09 MED ORDER — VALSARTAN 160 MG PO TABS: 160.0000 mg | ORAL_TABLET | Freq: Every day | ORAL | Status: DC

## 2015-05-09 NOTE — Progress Notes (Signed)
Pre visit review using our clinic review tool, if applicable. No additional management support is needed unless otherwise documented below in the visit note. 

## 2015-05-09 NOTE — Patient Instructions (Addendum)
BEFORE YOU LEAVE: -follow up in 3 months, come fasting and will do labs then  We recommend the following healthy lifestyle measures: - eat a healthy diet consisting of lots of vegetables, fruits, beans, nuts, seeds, healthy meats such as white chicken and fish and whole grains.  - avoid fried foods, fast food, processed foods, sodas, red meet and other fattening foods.  - get a least 150 minutes of aerobic exercise per week.   Amlactin foot cream nightly
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
## 2015-05-23 ENCOUNTER — Encounter: Payer: Self-pay | Admitting: Internal Medicine

## 2015-05-23 ENCOUNTER — Ambulatory Visit (INDEPENDENT_AMBULATORY_CARE_PROVIDER_SITE_OTHER): Payer: BLUE CROSS/BLUE SHIELD | Admitting: Internal Medicine

## 2015-05-23 VITALS — BP 130/82 | HR 87 | Ht 66.5 in | Wt 292.0 lb

## 2015-05-23 DIAGNOSIS — I10 Essential (primary) hypertension: Secondary | ICD-10-CM | POA: Diagnosis not present

## 2015-05-23 DIAGNOSIS — R058 Other specified cough: Secondary | ICD-10-CM

## 2015-05-23 DIAGNOSIS — R05 Cough: Secondary | ICD-10-CM

## 2015-05-23 MED ORDER — OMEPRAZOLE 40 MG PO CPDR: DELAYED_RELEASE_CAPSULE | ORAL | Status: DC

## 2015-05-23 NOTE — Patient Instructions (Signed)
Change prilosec Take 30-60 min before first meal of the day and take pepcid ac 20 mg at bedtime x 6 weeks and stop   Stop dulera   Try zyrtec at bedtime   Call [facility] if am cough gets worse for sinus CT    If you are satisfied with your treatment plan,  let your doctor know and he/she can either refill your medications or you can return here when your prescription runs out.     If in any way you are not 100% satisfied,  please tell us.  If 100% better, tell your friends!  Pulmonary follow up is as needed

## 2015-05-23 NOTE — Assessment & Plan Note (Signed)
Trial off altace 04/12/2015 due to cough/ pseudowheeze > improved  04/25/15   Adequate control on present rx, reviewed > no change in rx needed  = diovan 160 / would not rechallenge with acei in this setting

## 2015-05-23 NOTE — Assessment & Plan Note (Signed)
Body mass index is 46.43 kg/(m^2).  Lab Results  Component Value Date   TSH 2.38 03/27/2015     Contributing to gerd tendency/ doe/ needs to achieve and maintain neg calorie balance >f/u primary care

## 2015-05-23 NOTE — Assessment & Plan Note (Signed)
Minimal residual 6 weeks off acei but the acei was altace which is the most highly tissue bound so no further w/u needed  rec try de-escalate rx: stop dulera/ change ppi to qam and take zyrtec/pepcid hs x 6 weeks then try off these also   I had an extended final summary discussion with the patient reviewing all relevant studies completed to date and  lasting 15 to 20 minutes of a 25 minute visit    Each maintenance medication was reviewed in detail including most importantly the difference between maintenance and as needed and under what circumstances the prns are to be used.  Please see instructions for details which were reviewed in writing and the patient given a copy.
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
## 2015-07-02 DIAGNOSIS — D229 Melanocytic nevi, unspecified: Secondary | ICD-10-CM

## 2015-07-02 HISTORY — DX: Melanocytic nevi, unspecified: D22.9

## 2015-07-25 ENCOUNTER — Other Ambulatory Visit: Payer: Self-pay | Admitting: Family Medicine

## 2015-08-06 ENCOUNTER — Encounter: Payer: Self-pay | Admitting: Family Medicine

## 2015-08-06 ENCOUNTER — Other Ambulatory Visit: Payer: Self-pay | Admitting: Family Medicine

## 2015-08-06 DIAGNOSIS — E785 Hyperlipidemia, unspecified: Secondary | ICD-10-CM

## 2015-08-06 DIAGNOSIS — I10 Essential (primary) hypertension: Secondary | ICD-10-CM

## 2015-08-06 DIAGNOSIS — E119 Type 2 diabetes mellitus without complications: Secondary | ICD-10-CM

## 2015-08-06 DIAGNOSIS — E038 Other specified hypothyroidism: Secondary | ICD-10-CM

## 2015-08-08 ENCOUNTER — Other Ambulatory Visit: Payer: Self-pay | Admitting: Family Medicine

## 2015-08-09 ENCOUNTER — Other Ambulatory Visit: Payer: Self-pay | Admitting: Family Medicine

## 2015-08-09 ENCOUNTER — Telehealth: Payer: Self-pay | Admitting: Family Medicine

## 2015-08-09 NOTE — Telephone Encounter (Signed)
Rx done on 10/6.

## 2015-08-09 NOTE — Telephone Encounter (Signed)
Refill request for Levothyroxine 200 mcg and send to [facility].
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
## 2015-08-13 ENCOUNTER — Encounter: Payer: Self-pay | Admitting: *Deleted

## 2015-08-13 ENCOUNTER — Other Ambulatory Visit (INDEPENDENT_AMBULATORY_CARE_PROVIDER_SITE_OTHER): Payer: BLUE CROSS/BLUE SHIELD

## 2015-08-13 ENCOUNTER — Ambulatory Visit (INDEPENDENT_AMBULATORY_CARE_PROVIDER_SITE_OTHER): Payer: BLUE CROSS/BLUE SHIELD | Admitting: Cardiovascular Disease

## 2015-08-13 ENCOUNTER — Encounter: Payer: Self-pay | Admitting: Cardiovascular Disease

## 2015-08-13 VITALS — BP 160/70 | HR 73 | Ht 66.0 in | Wt 280.0 lb

## 2015-08-13 DIAGNOSIS — E038 Other specified hypothyroidism: Secondary | ICD-10-CM

## 2015-08-13 DIAGNOSIS — I10 Essential (primary) hypertension: Secondary | ICD-10-CM

## 2015-08-13 DIAGNOSIS — E119 Type 2 diabetes mellitus without complications: Secondary | ICD-10-CM

## 2015-08-13 DIAGNOSIS — E785 Hyperlipidemia, unspecified: Secondary | ICD-10-CM | POA: Diagnosis not present

## 2015-08-13 DIAGNOSIS — Z1159 Encounter for screening for other viral diseases: Secondary | ICD-10-CM

## 2015-08-13 LAB — TSH: TSH: 1.29 u[IU]/mL (ref 0.35–4.50)

## 2015-08-13 LAB — HEMOGLOBIN A1C: HEMOGLOBIN A1C: 6.6 % — AB (ref 4.6–6.5)

## 2015-08-13 LAB — BASIC METABOLIC PANEL
BUN: 19 mg/dL (ref 6–23)
CALCIUM: 9.7 mg/dL (ref 8.4–10.5)
CO2: 27 meq/L (ref 19–32)
Chloride: 103 mEq/L (ref 96–112)
Creatinine, Ser: 0.65 mg/dL (ref 0.40–1.20)
GFR: 98.82 mL/min (ref >60.00)
GLUCOSE: 132 mg/dL — AB (ref 70–99)
POTASSIUM: 4.7 meq/L (ref 3.5–5.1)
SODIUM: 140 meq/L (ref 135–145)

## 2015-08-13 LAB — LIPID PANEL
CHOLESTEROL: 162 mg/dL (ref 0–200)
HDL: 40.3 mg/dL (ref >39.00)
LDL Cholesterol: 90 mg/dL (ref 0–99)
NonHDL: 121.56
Total CHOL/HDL Ratio: 4
Triglycerides: 158 mg/dL — ABNORMAL HIGH (ref 0.0–149.0)
VLDL: 31.6 mg/dL (ref 0.0–40.0)

## 2015-08-13 MED ORDER — VALSARTAN 160 MG PO TABS: 160.0000 mg | ORAL_TABLET | Freq: Every day | ORAL | Status: DC

## 2015-08-13 MED ORDER — DILTIAZEM HCL ER COATED BEADS 120 MG PO CP24: 120.0000 mg | ORAL_CAPSULE | Freq: Every day | ORAL | Status: DC

## 2015-08-13 NOTE — Patient Instructions (Signed)
Medication Instructions:  Your physician recommends that you continue on your current medications as directed. Please refer to the Current Medication list given to you today.   Labwork: none  Testing/Procedures: none  Follow-Up: Your physician wants you to follow-up in:  12 months.  You will receive a reminder letter in the mail two months in advance. If you don't receive a letter, please call our office to schedule the follow-up appointment.        

## 2015-08-14 LAB — HEPATITIS C ANTIBODY: HCV AB: NEGATIVE

## 2015-08-15 ENCOUNTER — Encounter: Payer: Self-pay | Admitting: Family Medicine

## 2015-08-15 ENCOUNTER — Ambulatory Visit (INDEPENDENT_AMBULATORY_CARE_PROVIDER_SITE_OTHER): Payer: BLUE CROSS/BLUE SHIELD | Admitting: Family Medicine

## 2015-08-15 ENCOUNTER — Telehealth: Payer: Self-pay | Admitting: *Deleted

## 2015-08-15 VITALS — BP 126/82 | HR 77 | Temp 98.1°F | Ht 66.0 in | Wt 282.8 lb

## 2015-08-15 DIAGNOSIS — E119 Type 2 diabetes mellitus without complications: Secondary | ICD-10-CM

## 2015-08-15 DIAGNOSIS — I10 Essential (primary) hypertension: Secondary | ICD-10-CM | POA: Diagnosis not present

## 2015-08-15 DIAGNOSIS — E038 Other specified hypothyroidism: Secondary | ICD-10-CM

## 2015-08-15 DIAGNOSIS — E785 Hyperlipidemia, unspecified: Secondary | ICD-10-CM

## 2015-08-15 MED ORDER — LEVOTHYROXINE SODIUM 200 MCG PO TABS: 200.0000 ug | ORAL_TABLET | Freq: Every day | ORAL | Status: DC
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
## 2015-08-15 NOTE — Progress Notes (Signed)
Pre visit review using our clinic review tool, if applicable. No additional management support is needed unless otherwise documented below in the visit note. 

## 2015-08-15 NOTE — Telephone Encounter (Signed)
I called [facility] per Dr Maudie Mercury and spoke with Altha Harm and she stated they did not receive the Rx that was sent e-scribe on 10/6.  Altha Harm said for some reason it is showing that they received it on our end but they did not.  I called in the Rx as the pt only has one pill left and she stated the prescription will go out the next day to the pt and I informed Mrs Mewborn of this.

## 2015-08-19 ENCOUNTER — Other Ambulatory Visit: Payer: Self-pay

## 2015-10-22 ENCOUNTER — Other Ambulatory Visit: Payer: Self-pay

## 2015-10-22 DIAGNOSIS — Z1231 Encounter for screening mammogram for malignant neoplasm of breast: Secondary | ICD-10-CM

## 2015-11-07 ENCOUNTER — Telehealth: Payer: Self-pay | Admitting: Family Medicine

## 2015-11-07 ENCOUNTER — Encounter: Payer: Self-pay | Admitting: Family Medicine

## 2015-11-07 ENCOUNTER — Other Ambulatory Visit: Payer: Self-pay | Admitting: *Deleted

## 2015-11-07 MED ORDER — METFORMIN HCL 1000 MG PO TABS: 1000.0000 mg | ORAL_TABLET | Freq: Two times a day (BID) | ORAL | Status: DC

## 2015-11-07 NOTE — Telephone Encounter (Signed)
Rx done for a one month supply and the pt was informed via Mychart message.

## 2015-11-07 NOTE — Telephone Encounter (Signed)
Rx done. 

## 2015-11-07 NOTE — Telephone Encounter (Signed)
Pt needs new rx metformin 1000 mg twice a day  #180 send to [facility]

## 2015-11-14 ENCOUNTER — Ambulatory Visit
Admission: RE | Admit: 2015-11-14 | Discharge: 2015-11-14 | Disposition: A | Discharge status: Home / Self Care | Payer: BLUE CROSS/BLUE SHIELD | Source: Ambulatory Visit

## 2015-11-14 DIAGNOSIS — Z1231 Encounter for screening mammogram for malignant neoplasm of breast: Secondary | ICD-10-CM

## 2015-11-20 ENCOUNTER — Other Ambulatory Visit: Payer: Self-pay | Admitting: Physician Assistant

## 2015-12-19 ENCOUNTER — Encounter: Payer: Self-pay | Admitting: Family Medicine

## 2015-12-19 ENCOUNTER — Other Ambulatory Visit: Payer: Self-pay | Admitting: *Deleted

## 2015-12-19 MED ORDER — OMEPRAZOLE 40 MG PO CPDR: 40.0000 mg | DELAYED_RELEASE_CAPSULE | Freq: Every day | ORAL | Status: DC

## 2015-12-19 NOTE — Telephone Encounter (Signed)
Hannah Parsons, I do not see any requests for omeprazole. Do you? Please notify pt if we did not receive request and ok to refill. Thanks.

## 2015-12-19 NOTE — Telephone Encounter (Signed)
Rx done and the pt was informed via Mychart message. 

## 2016-01-08 ENCOUNTER — Encounter: Payer: Self-pay | Admitting: Family Medicine

## 2016-01-15 ENCOUNTER — Ambulatory Visit (INDEPENDENT_AMBULATORY_CARE_PROVIDER_SITE_OTHER): Payer: BLUE CROSS/BLUE SHIELD | Admitting: Family Medicine

## 2016-01-15 ENCOUNTER — Encounter: Payer: Self-pay | Admitting: Family Medicine

## 2016-01-15 VITALS — BP 122/78 | HR 81 | Temp 98.0°F | Ht 66.0 in | Wt 284.8 lb

## 2016-01-15 DIAGNOSIS — E785 Hyperlipidemia, unspecified: Secondary | ICD-10-CM | POA: Diagnosis not present

## 2016-01-15 DIAGNOSIS — I10 Essential (primary) hypertension: Secondary | ICD-10-CM | POA: Diagnosis not present

## 2016-01-15 DIAGNOSIS — E119 Type 2 diabetes mellitus without complications: Secondary | ICD-10-CM

## 2016-01-15 DIAGNOSIS — J069 Acute upper respiratory infection, unspecified: Secondary | ICD-10-CM

## 2016-01-15 DIAGNOSIS — E038 Other specified hypothyroidism: Secondary | ICD-10-CM

## 2016-01-15 LAB — BASIC METABOLIC PANEL WITH GFR
BUN: 15 mg/dL (ref 6–23)
CO2: 29 meq/L (ref 19–32)
Calcium: 9.6 mg/dL (ref 8.4–10.5)
Chloride: 102 meq/L (ref 96–112)
Creatinine, Ser: 0.65 mg/dL (ref 0.40–1.20)
GFR: 98.68 mL/min
Glucose, Bld: 141 mg/dL — ABNORMAL HIGH (ref 70–99)
Potassium: 4.3 meq/L (ref 3.5–5.1)
Sodium: 139 meq/L (ref 135–145)

## 2016-01-15 LAB — POCT GLYCOSYLATED HEMOGLOBIN (HGB A1C): HEMOGLOBIN A1C: 6.3

## 2016-01-15 LAB — TSH: TSH: 0.52 u[IU]/mL (ref 0.35–4.50)

## 2016-01-15 NOTE — Progress Notes (Signed)
Pre visit review using our clinic review tool, if applicable. No additional management support is needed unless otherwise documented below in the visit note. 
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
## 2016-01-15 NOTE — Patient Instructions (Signed)
Before you leave: -Labs  -Schedule follow-up in 4 months  For the cold symptoms:Can use Afrin nasal spray for 3-5 days. Then he must stop. Tylenol or Aleve as needed. Follow-up if you have shortness of breath, worsening symptoms or symptoms persist longer than expected.  -We have ordered labs or studies at this visit. It can take up to 1-2 weeks for results and processing. We will contact you with instructions IF your results are abnormal. Normal results will be released to your [facility]. If you have not heard from [place] or can not find your results in [facility] in 2 weeks please contact our office.  We recommend the following healthy lifestyle measures: - eat a healthy whole foods diet consisting of regular small meals composed of vegetables, fruits, beans, nuts, seeds, healthy meats such as white chicken and fish and whole grains.  - avoid sweets, white starchy foods, fried foods, fast food, processed foods, sodas, red meet and other fattening foods.  - get a least 150-300 minutes of aerobic exercise per week.

## 2016-01-31 ENCOUNTER — Other Ambulatory Visit: Payer: Self-pay | Admitting: Family Medicine

## 2016-01-31 MED ORDER — METFORMIN HCL 1000 MG PO TABS
1000.0000 mg | ORAL_TABLET | Freq: Two times a day (BID) | ORAL | Status: DC
Start: 2016-01-31 — End: 2016-10-16

## 2016-02-27 LAB — HM DIABETES EYE EXAM

## 2016-03-05 ENCOUNTER — Encounter: Payer: Self-pay | Admitting: Family Medicine

## 2016-03-20 ENCOUNTER — Encounter: Payer: Self-pay | Admitting: Family Medicine

## 2016-03-24 ENCOUNTER — Other Ambulatory Visit: Payer: Self-pay

## 2016-03-24 MED ORDER — OMEPRAZOLE 40 MG PO CPDR: 40.0000 mg | DELAYED_RELEASE_CAPSULE | Freq: Every day | ORAL | Status: DC

## 2016-04-01 ENCOUNTER — Ambulatory Visit (INDEPENDENT_AMBULATORY_CARE_PROVIDER_SITE_OTHER): Payer: BLUE CROSS/BLUE SHIELD | Admitting: *Deleted

## 2016-04-01 DIAGNOSIS — Z23 Encounter for immunization: Secondary | ICD-10-CM

## 2016-04-02 ENCOUNTER — Other Ambulatory Visit: Payer: Self-pay | Admitting: Family Medicine

## 2016-05-15 ENCOUNTER — Ambulatory Visit: Payer: BLUE CROSS/BLUE SHIELD | Admitting: Family Medicine

## 2016-05-21 ENCOUNTER — Ambulatory Visit: Payer: BLUE CROSS/BLUE SHIELD | Admitting: Family Medicine

## 2016-06-04 ENCOUNTER — Other Ambulatory Visit: Payer: Self-pay | Admitting: *Deleted

## 2016-06-04 MED ORDER — ONETOUCH DELICA LANCETS 33G MISC: 1 refills | Status: DC

## 2016-06-04 MED ORDER — GLUCOSE BLOOD VI STRP: ORAL_STRIP | 1 refills | Status: DC

## 2016-07-13 ENCOUNTER — Ambulatory Visit (INDEPENDENT_AMBULATORY_CARE_PROVIDER_SITE_OTHER): Payer: PRIVATE HEALTH INSURANCE | Admitting: Family Medicine

## 2016-07-13 VITALS — BP 120/86 | HR 67 | Temp 97.7°F | Ht 66.0 in | Wt 245.4 lb

## 2016-07-13 DIAGNOSIS — E785 Hyperlipidemia, unspecified: Secondary | ICD-10-CM | POA: Diagnosis not present

## 2016-07-13 DIAGNOSIS — Z23 Encounter for immunization: Secondary | ICD-10-CM | POA: Diagnosis not present

## 2016-07-13 DIAGNOSIS — E119 Type 2 diabetes mellitus without complications: Secondary | ICD-10-CM | POA: Diagnosis not present

## 2016-07-13 DIAGNOSIS — E038 Other specified hypothyroidism: Secondary | ICD-10-CM

## 2016-07-13 DIAGNOSIS — M62838 Other muscle spasm: Secondary | ICD-10-CM

## 2016-07-13 DIAGNOSIS — Z6839 Body mass index (BMI) 39.0-39.9, adult: Secondary | ICD-10-CM

## 2016-07-13 DIAGNOSIS — I10 Essential (primary) hypertension: Secondary | ICD-10-CM | POA: Diagnosis not present

## 2016-07-13 LAB — POCT GLYCOSYLATED HEMOGLOBIN (HGB A1C): HEMOGLOBIN A1C: 5.9

## 2016-07-13 MED ORDER — CYCLOBENZAPRINE HCL 5 MG PO TABS: 5.0000 mg | ORAL_TABLET | Freq: Three times a day (TID) | ORAL | 1 refills | Status: DC | PRN

## 2016-07-13 NOTE — Progress Notes (Signed)
Pre visit review using our clinic review tool, if applicable. No additional management support is needed unless otherwise documented below in the visit note. 
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
## 2016-07-13 NOTE — Patient Instructions (Signed)
BEFORE YOU LEAVE: -follow up: physical in 3-4 months; come fasting if possible -flu shot -POC hgba1c -lab -low back exercises  CONGRATULATIONS on the healthy lifestyle!  Be sure to include a core workout - doing the exercises provided 4 days per week would be good for your back.   We recommend the following healthy lifestyle for LIFE: 1) Small portions.   Tip: eat off of a salad plate instead of a dinner plate.  Tip: It is ok to feel hungry after a meal - that likely means you ate an appropriate portion.  Tip: if you need more or a snack choose fruits, veggies and/or a handful of nuts or seeds.  2) Eat a healthy clean diet.  * Tip: Avoid (less then 1 serving per week): processed foods, sweets, sweetened drinks, white starches (rice, flour, bread, potatoes, pasta, etc), red meat, fast foods, butter  *Tip: CHOOSE instead   * 5-9 servings per day of fresh or frozen fruits and vegetables (but not corn, potatoes, bananas, canned or dried fruit)   *nuts and seeds, beans   *olives and olive oil   *small portions of lean meats such as fish and white chicken    *small portions of whole grains  3)Get at least 150 minutes of sweaty aerobic exercise per week.  4)Reduce stress - consider counseling, meditation and relaxation to balance other aspects of your life.

## 2016-07-14 LAB — BASIC METABOLIC PANEL
BUN: 13 mg/dL (ref 6–23)
CALCIUM: 9.4 mg/dL (ref 8.4–10.5)
CHLORIDE: 103 meq/L (ref 96–112)
CO2: 27 meq/L (ref 19–32)
CREATININE: 0.6 mg/dL (ref 0.40–1.20)
GFR: 108.05 mL/min (ref >60.00)
Glucose, Bld: 76 mg/dL (ref 70–99)
Potassium: 4.4 mEq/L (ref 3.5–5.1)
SODIUM: 137 meq/L (ref 135–145)

## 2016-07-14 LAB — CBC
HEMATOCRIT: 40.1 % (ref 36.0–46.0)
Hemoglobin: 13.4 g/dL (ref 12.0–15.0)
MCHC: 33.4 g/dL (ref 30.0–36.0)
MCV: 87.4 fl (ref 78.0–100.0)
PLATELETS: 243 10*3/uL (ref 150.0–400.0)
RBC: 4.59 Mil/uL (ref 3.87–5.11)
RDW: 14 % (ref 11.5–15.5)
WBC: 7.4 10*3/uL (ref 4.0–10.5)

## 2016-07-14 LAB — TSH: TSH: 0.48 u[IU]/mL (ref 0.35–4.50)

## 2016-08-02 ENCOUNTER — Other Ambulatory Visit: Payer: Self-pay | Admitting: Family Medicine

## 2016-08-02 ENCOUNTER — Encounter: Payer: Self-pay | Admitting: Family Medicine

## 2016-08-03 ENCOUNTER — Other Ambulatory Visit: Payer: Self-pay | Admitting: Family Medicine

## 2016-08-03 MED ORDER — OMEPRAZOLE 40 MG PO CPDR: 40.0000 mg | DELAYED_RELEASE_CAPSULE | Freq: Every day | ORAL | 1 refills | Status: DC

## 2016-08-03 MED ORDER — FLUCONAZOLE 150 MG PO TABS: 150.0000 mg | ORAL_TABLET | Freq: Once | ORAL | 0 refills | Status: AC

## 2016-08-03 MED ORDER — LEVOTHYROXINE SODIUM 200 MCG PO TABS: 200.0000 ug | ORAL_TABLET | Freq: Every day | ORAL | 3 refills | Status: DC

## 2016-08-04 ENCOUNTER — Encounter: Payer: Self-pay | Admitting: Family Medicine

## 2016-08-04 ENCOUNTER — Other Ambulatory Visit: Payer: Self-pay | Admitting: *Deleted

## 2016-08-04 MED ORDER — LEVOTHYROXINE SODIUM 200 MCG PO TABS: 200.0000 ug | ORAL_TABLET | Freq: Every day | ORAL | 3 refills | Status: DC

## 2016-08-04 NOTE — Telephone Encounter (Signed)
Rx done and the pt was notified via Mychart message. 

## 2016-08-12 ENCOUNTER — Other Ambulatory Visit: Payer: Self-pay | Admitting: Cardiovascular Disease

## 2016-08-12 MED ORDER — DILTIAZEM HCL ER COATED BEADS 120 MG PO CP24: 120.0000 mg | ORAL_CAPSULE | Freq: Every day | ORAL | 0 refills | Status: DC
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
## 2016-10-06 ENCOUNTER — Encounter: Payer: Self-pay | Admitting: Family Medicine

## 2016-10-06 ENCOUNTER — Ambulatory Visit (INDEPENDENT_AMBULATORY_CARE_PROVIDER_SITE_OTHER): Payer: BLUE CROSS/BLUE SHIELD | Admitting: Family Medicine

## 2016-10-06 VITALS — BP 130/90 | HR 67 | Temp 97.6°F | Ht 65.75 in | Wt 236.6 lb

## 2016-10-06 DIAGNOSIS — Z0000 Encounter for general adult medical examination without abnormal findings: Secondary | ICD-10-CM | POA: Diagnosis not present

## 2016-10-06 DIAGNOSIS — E119 Type 2 diabetes mellitus without complications: Secondary | ICD-10-CM

## 2016-10-06 DIAGNOSIS — I10 Essential (primary) hypertension: Secondary | ICD-10-CM | POA: Diagnosis not present

## 2016-10-06 DIAGNOSIS — E038 Other specified hypothyroidism: Secondary | ICD-10-CM | POA: Diagnosis not present

## 2016-10-06 DIAGNOSIS — E785 Hyperlipidemia, unspecified: Secondary | ICD-10-CM | POA: Diagnosis not present

## 2016-10-06 DIAGNOSIS — Z6838 Body mass index (BMI) 38.0-38.9, adult: Secondary | ICD-10-CM

## 2016-10-06 LAB — HEMOGLOBIN A1C: HEMOGLOBIN A1C: 6 % (ref 4.6–6.5)

## 2016-10-06 LAB — CBC
HCT: 41.9 % (ref 36.0–46.0)
HEMOGLOBIN: 14 g/dL (ref 12.0–15.0)
MCHC: 33.5 g/dL (ref 30.0–36.0)
MCV: 88.1 fl (ref 78.0–100.0)
PLATELETS: 229 10*3/uL (ref 150.0–400.0)
RBC: 4.75 Mil/uL (ref 3.87–5.11)
RDW: 14.1 % (ref 11.5–15.5)
WBC: 6.1 10*3/uL (ref 4.0–10.5)

## 2016-10-06 LAB — LIPID PANEL
CHOLESTEROL: 167 mg/dL (ref 0–200)
HDL: 46.2 mg/dL (ref >39.00)
LDL CALC: 95 mg/dL (ref 0–99)
NONHDL: 121.17
Total CHOL/HDL Ratio: 4
Triglycerides: 131 mg/dL (ref 0.0–149.0)
VLDL: 26.2 mg/dL (ref 0.0–40.0)

## 2016-10-06 LAB — BASIC METABOLIC PANEL
BUN: 16 mg/dL (ref 6–23)
CALCIUM: 9.3 mg/dL (ref 8.4–10.5)
CO2: 29 mEq/L (ref 19–32)
Chloride: 102 mEq/L (ref 96–112)
Creatinine, Ser: 0.67 mg/dL (ref 0.40–1.20)
GFR: 95.06 mL/min (ref >60.00)
GLUCOSE: 109 mg/dL — AB (ref 70–99)
Potassium: 4.4 mEq/L (ref 3.5–5.1)
SODIUM: 138 meq/L (ref 135–145)

## 2016-10-06 LAB — TSH: TSH: 0.78 u[IU]/mL (ref 0.35–4.50)

## 2016-10-06 NOTE — Patient Instructions (Addendum)
BEFORE YOU LEAVE: -recheck blood pressure -follow up: in 3 months -labs  Schedule mammogram  We have ordered labs or studies at this visit. It can take up to 1-2 weeks for results and processing. IF results require follow up or explanation, we will call you with instructions. Clinically stable results will be released to your [facility]. If you have not heard from [place] or cannot find your results in [facility] in 2 weeks please contact our office at [phone].   If you are not yet signed up for [facility], please SIGN UP TODAY. We now offer online scheduling, same day appointments and extended hours. WHEN YOU DON'T FEEL YOUR BEST.Marland KitchenMarland KitchenWE ARE HERE TO HELP.   We recommend the following healthy lifestyle for LIFE: 1) Small portions.   Tip: eat off of a salad plate instead of a dinner plate.  Tip: if you need more or a snack choose fruits, veggies and/or a handful of nuts or seeds.  2) Eat a healthy clean diet.  * Tip: Avoid (less then 1 serving per week): processed foods, sweets, sweetened drinks, white starches (rice, flour, bread, potatoes, pasta, etc), red meat, fast foods, butter  *Tip: CHOOSE instead   * 5-9 servings per day of fresh or frozen fruits and vegetables (but not corn, potatoes, bananas, canned or dried fruit)   *nuts and seeds, beans   *olives and olive oil   *small portions of lean meats such as fish and white chicken    *small portions of whole grains  3)Get at least 150 minutes of sweaty aerobic exercise per week.  4)Reduce stress - consider counseling, meditation and relaxation to balance other aspects of your life.

## 2016-10-06 NOTE — Progress Notes (Signed)
Pre visit review using our clinic review tool, if applicable. No additional management support is needed unless otherwise documented below in the visit note. 

## 2016-10-15 ENCOUNTER — Encounter: Payer: PRIVATE HEALTH INSURANCE | Admitting: Family Medicine

## 2016-10-16 ENCOUNTER — Encounter: Payer: Self-pay | Admitting: Family Medicine

## 2016-10-16 ENCOUNTER — Other Ambulatory Visit: Payer: Self-pay | Admitting: Emergency Medicine

## 2016-10-16 MED ORDER — METFORMIN HCL 1000 MG PO TABS: 1000.0000 mg | ORAL_TABLET | Freq: Two times a day (BID) | ORAL | 1 refills | Status: DC

## 2016-10-16 MED ORDER — OMEPRAZOLE 40 MG PO CPDR: 40.0000 mg | DELAYED_RELEASE_CAPSULE | Freq: Every day | ORAL | 1 refills | Status: DC

## 2016-10-16 MED ORDER — LEVOTHYROXINE SODIUM 200 MCG PO TABS: 200.0000 ug | ORAL_TABLET | Freq: Every day | ORAL | 3 refills | Status: DC

## 2016-10-19 ENCOUNTER — Encounter: Payer: Self-pay | Admitting: Family Medicine

## 2016-10-20 MED ORDER — CYCLOBENZAPRINE HCL 5 MG PO TABS: 5.0000 mg | ORAL_TABLET | Freq: Three times a day (TID) | ORAL | 1 refills | Status: DC | PRN

## 2016-11-07 ENCOUNTER — Other Ambulatory Visit: Payer: Self-pay | Admitting: Cardiovascular Disease

## 2016-11-13 ENCOUNTER — Other Ambulatory Visit: Payer: Self-pay | Admitting: Cardiovascular Disease

## 2016-11-13 MED ORDER — DILTIAZEM HCL ER COATED BEADS 120 MG PO CP24: 120.0000 mg | ORAL_CAPSULE | Freq: Every day | ORAL | 0 refills | Status: DC

## 2016-11-20 NOTE — Telephone Encounter (Signed)
Spoke with patient as in her email she had requested that [facility], but [facility] was [facility]. Patient stated that was fine as her company is actually requiring that she use [facility]. Patient very appreciative of call.

## 2016-11-27 ENCOUNTER — Ambulatory Visit (INDEPENDENT_AMBULATORY_CARE_PROVIDER_SITE_OTHER): Payer: BLUE CROSS/BLUE SHIELD | Admitting: Cardiovascular Disease

## 2016-11-27 VITALS — BP 148/98 | HR 60 | Ht 66.5 in | Wt 242.2 lb

## 2016-11-27 DIAGNOSIS — I10 Essential (primary) hypertension: Secondary | ICD-10-CM | POA: Diagnosis not present

## 2016-11-27 MED ORDER — VALSARTAN 160 MG PO TABS: 160.0000 mg | ORAL_TABLET | Freq: Two times a day (BID) | ORAL | 3 refills | Status: DC
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
## 2016-11-27 NOTE — Patient Instructions (Signed)
Medication Instructions:  Your physician has recommended you make the following change in your medication:  Increase Diovan to 160 mg by mouth twice daily.   Labwork: Your physician recommends that you return for lab work on Feb 6,2018.  The lab opens at 7:30 AM   Testing/Procedures: none  Follow-Up: Your physician recommends that you schedule a follow-up appointment in: 12 months. Please call our office in about 9 months to schedule this appointment.     Any Other Special Instructions Will Be Listed Below (If Applicable).     If you need a refill on your cardiac medications before your next appointment, please call your pharmacy.

## 2016-12-04 ENCOUNTER — Other Ambulatory Visit: Payer: Self-pay | Admitting: Family Medicine

## 2016-12-04 DIAGNOSIS — Z1231 Encounter for screening mammogram for malignant neoplasm of breast: Secondary | ICD-10-CM

## 2016-12-08 ENCOUNTER — Other Ambulatory Visit: Payer: BLUE CROSS/BLUE SHIELD | Admitting: *Deleted

## 2016-12-08 DIAGNOSIS — I10 Essential (primary) hypertension: Secondary | ICD-10-CM

## 2016-12-09 LAB — BASIC METABOLIC PANEL
BUN/Creatinine Ratio: 24 (ref 12–28)
BUN: 14 mg/dL (ref 8–27)
CALCIUM: 9.6 mg/dL (ref 8.7–10.3)
CHLORIDE: 100 mmol/L (ref 96–106)
CO2: 25 mmol/L (ref 18–29)
Creatinine, Ser: 0.59 mg/dL (ref 0.57–1.00)
GFR calc non Af Amer: 99 mL/min/{1.73_m2} (ref >59)
GFR, EST AFRICAN AMERICAN: 114 mL/min/{1.73_m2} (ref >59)
Glucose: 86 mg/dL (ref 65–99)
POTASSIUM: 4.4 mmol/L (ref 3.5–5.2)
Sodium: 141 mmol/L (ref 134–144)

## 2017-01-05 ENCOUNTER — Ambulatory Visit
Admission: RE | Admit: 2017-01-05 | Discharge: 2017-01-05 | Disposition: A | Discharge status: Home / Self Care | Payer: BLUE CROSS/BLUE SHIELD | Source: Ambulatory Visit | Attending: Family Medicine | Admitting: Family Medicine

## 2017-01-05 DIAGNOSIS — Z1231 Encounter for screening mammogram for malignant neoplasm of breast: Secondary | ICD-10-CM

## 2017-01-10 ENCOUNTER — Other Ambulatory Visit: Payer: Self-pay | Admitting: Cardiovascular Disease

## 2017-01-11 ENCOUNTER — Other Ambulatory Visit: Payer: Self-pay | Admitting: *Deleted

## 2017-01-11 MED ORDER — DILTIAZEM HCL ER COATED BEADS 120 MG PO CP24: 120.0000 mg | ORAL_CAPSULE | Freq: Every day | ORAL | 0 refills | Status: DC

## 2017-01-11 MED ORDER — DILTIAZEM HCL ER COATED BEADS 120 MG PO CP24
120.0000 mg | ORAL_CAPSULE | Freq: Every day | ORAL | 2 refills | Status: DC
Start: 2017-01-11 — End: 2017-09-27

## 2017-01-11 NOTE — Telephone Encounter (Signed)
diltiazem (CARDIZEM CD) 120 MG 24 hr capsule  Medication  Date: 11/13/2016 Department: [address] Office Ordering/Authorizing: Burnell Blanks, MD  Order Providers   Prescribing Provider Encounter Provider  Burnell Blanks, MD Burnell Blanks, MD  Medication Detail    Disp Refills Start End   diltiazem [facility] CD) 120 MG 24 hr capsule 90 capsule 0 11/13/2016    Sig - Route: Take 1 capsule (120 mg total) by mouth daily. Please call and schedule a one year follow up appointment - Oral   E-Prescribing Status: Receipt confirmed by pharmacy (11/13/2016 5:46 PM EST)   Pharmacy   [place]   PT HAD APPT 11/2016, SENT IN A YEAR SUPPLY

## 2017-01-12 ENCOUNTER — Ambulatory Visit (INDEPENDENT_AMBULATORY_CARE_PROVIDER_SITE_OTHER): Payer: BLUE CROSS/BLUE SHIELD | Admitting: Family Medicine

## 2017-01-12 ENCOUNTER — Encounter: Payer: Self-pay | Admitting: Family Medicine

## 2017-01-12 VITALS — BP 142/84 | HR 66 | Temp 97.8°F | Ht 66.5 in | Wt 248.0 lb

## 2017-01-12 DIAGNOSIS — K649 Unspecified hemorrhoids: Secondary | ICD-10-CM

## 2017-01-12 MED ORDER — HYDROCORTISONE 2.5 % RE CREA: 1.0000 "application " | TOPICAL_CREAM | Freq: Two times a day (BID) | RECTAL | 0 refills | Status: DC

## 2017-01-12 NOTE — Progress Notes (Signed)
Pre visit review using our clinic review tool, if applicable. No additional management support is needed unless otherwise documented below in the visit note. 

## 2017-01-12 NOTE — Patient Instructions (Signed)
Hemorrhoids Hemorrhoids are swollen veins in and around the rectum or anus. Hemorrhoids can cause pain, itching, or bleeding. Most of the time, they do not cause serious problems. They usually get better with diet changes, lifestyle changes, and other home treatments. Follow these instructions at home: Eating and drinking   Eat foods that have fiber, such as whole grains, beans, nuts, fruits, and vegetables. Ask your doctor about taking products that have added fiber (fibersupplements).  Drink enough fluid to keep your pee (urine) clear or pale yellow. For Pain and Swelling   Take a warm-water bath (sitz bath) for 20 minutes to ease pain. Do this 3-4 times a day.  If directed, put ice on the painful area. It may be helpful to use ice between your warm baths.  Put ice in a plastic bag.  Place a towel between your skin and the bag.  Leave the ice on for 20 minutes, 2-3 times a day. General instructions   Take over-the-counter and prescription medicines only as told by your doctor.  Medicated creams and medicines that are inserted into the anus (suppositories) may be used or applied as told.  Exercise often.  Go to the bathroom when you have the urge to poop (to have a bowel movement). Do not wait.  Avoid pushing too hard (straining) when you poop.  Keep the butt area dry and clean. Use wet toilet paper or moist paper towels.  Do not sit on the toilet for a long time. Contact a doctor if:  You have any of these:  Pain and swelling that do not get better with treatment or medicine.  Bleeding that will not stop.  Trouble pooping or you cannot poop.  Pain or swelling outside the area of the hemorrhoids. This information is not intended to replace advice given to you by your health care provider. Make sure you discuss any questions you have with your health care provider. Document Released: 07/28/2008 Document Revised: 03/26/2016 Document Reviewed: 07/03/2015 Elsevier  Interactive Patient Education  2017 [employer].    How to Take a [employer] A sitz bath is a warm water bath that is taken while you are sitting down. The water should only come up to your hips and should cover your buttocks. Your health care provider may recommend a sitz bath to help you:  Clean the lower part of your body, including your genital area.  With itching.  With pain.  With sore muscles or muscles that tighten or spasm. How to take a sitz bath Take 3-4 sitz baths per day or as told by your health care provider. 1. Partially fill a bathtub with warm water. You will only need the water to be deep enough to cover your hips and buttocks when you are sitting in it. 2. If your health care provider told you to put medicine in the water, follow the directions exactly. 3. Sit in the water and open the tub drain a little. 4. Turn on the warm water again to keep the tub at the correct level. Keep the water running constantly. 5. Soak in the water for 15-20 minutes or as told by your health care provider. 6. After the sitz bath, pat the affected area dry first. Do not rub it. 7. Be careful when you stand up after the sitz bath because you may feel dizzy. Contact a health care provider if:  Your symptoms get worse. Do not continue with sitz baths if your symptoms get worse.  You have  new symptoms. Do not continue with sitz baths until you talk with your health care provider. This information is not intended to replace advice given to you by your health care provider. Make sure you discuss any questions you have with your health care provider. Document Released: 07/11/2004 Document Revised: 03/18/2016 Document Reviewed: 10/17/2014 Elsevier Interactive Patient Education  2017 [employer].
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
## 2017-01-13 ENCOUNTER — Encounter: Payer: Self-pay | Admitting: Family Medicine

## 2017-01-13 ENCOUNTER — Telehealth: Payer: Self-pay | Admitting: *Deleted

## 2017-01-13 ENCOUNTER — Encounter: Payer: Self-pay | Admitting: Cardiovascular Disease

## 2017-01-13 NOTE — Telephone Encounter (Signed)
called [facility] spoke with djkara about diltizem 120 mg, shipped 90 day 11/17/16, arrived 11/19/16, recieved new RX but to soon to ship, called pt & LVM for her to call back to explain this to her.
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
## 2017-02-04 ENCOUNTER — Ambulatory Visit: Payer: BLUE CROSS/BLUE SHIELD | Admitting: Family Medicine
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
## 2017-02-11 ENCOUNTER — Ambulatory Visit (INDEPENDENT_AMBULATORY_CARE_PROVIDER_SITE_OTHER): Payer: BLUE CROSS/BLUE SHIELD | Admitting: Family Medicine

## 2017-02-11 ENCOUNTER — Encounter: Payer: Self-pay | Admitting: Family Medicine

## 2017-02-11 VITALS — BP 138/84 | HR 68 | Temp 97.9°F | Ht 66.5 in | Wt 245.6 lb

## 2017-02-11 DIAGNOSIS — I10 Essential (primary) hypertension: Secondary | ICD-10-CM | POA: Diagnosis not present

## 2017-02-11 DIAGNOSIS — Z6839 Body mass index (BMI) 39.0-39.9, adult: Secondary | ICD-10-CM

## 2017-02-11 DIAGNOSIS — E669 Obesity, unspecified: Secondary | ICD-10-CM

## 2017-02-11 DIAGNOSIS — E785 Hyperlipidemia, unspecified: Secondary | ICD-10-CM

## 2017-02-11 DIAGNOSIS — IMO0001 Reserved for inherently not codable concepts without codable children: Secondary | ICD-10-CM

## 2017-02-11 DIAGNOSIS — E119 Type 2 diabetes mellitus without complications: Secondary | ICD-10-CM

## 2017-02-11 NOTE — Progress Notes (Signed)
Pre visit review using our clinic review tool, if applicable. No additional management support is needed unless otherwise documented below in the visit note. 

## 2017-02-11 NOTE — Patient Instructions (Signed)
BEFORE YOU LEAVE: -follow up: 3-4 months   Monitor blood pressure at home a few days per week if you can. Keep log. Let us know if continues to run high despite improving diet and exercise.   We recommend the following healthy lifestyle for LIFE: 1) Small portions.   Tip: eat off of a salad plate instead of a dinner plate.  Tip: It is ok to feel hungry after a meal of proper portion size.  Tip: if you need more or a snack choose fruits, veggies and/or a handful of nuts or seeds.  2) Eat a healthy clean diet.  * Tip: Avoid (less then 1 serving per week): processed foods, sweets, sweetened drinks, white starches (rice, flour, bread, potatoes, pasta, etc), red meat, fast foods, butter  *Tip: CHOOSE instead   * 5-9 servings per day of fresh or frozen fruits and vegetables (but not corn, potatoes, bananas, canned or dried fruit)   *nuts and seeds, beans   *olives and olive oil   *small portions of lean meats such as fish and white chicken    *small portions of whole grains  3)Get at least 150 minutes of sweaty aerobic exercise per week.  4)Reduce stress - consider counseling, meditation and relaxation to balance other aspects of your life.

## 2017-03-02 LAB — HM DIABETES EYE EXAM

## 2017-03-25 ENCOUNTER — Ambulatory Visit (INDEPENDENT_AMBULATORY_CARE_PROVIDER_SITE_OTHER): Payer: BLUE CROSS/BLUE SHIELD | Admitting: Obstetrics and Gynecology

## 2017-03-25 ENCOUNTER — Encounter: Payer: Self-pay | Admitting: Obstetrics and Gynecology

## 2017-03-25 VITALS — BP 114/80 | HR 70 | Resp 16 | Ht 65.75 in | Wt 246.0 lb

## 2017-03-25 DIAGNOSIS — N816 Rectocele: Secondary | ICD-10-CM | POA: Diagnosis not present

## 2017-03-25 DIAGNOSIS — Z0000 Encounter for general adult medical examination without abnormal findings: Secondary | ICD-10-CM | POA: Diagnosis not present

## 2017-03-25 DIAGNOSIS — Z01419 Encounter for gynecological examination (general) (routine) without abnormal findings: Secondary | ICD-10-CM

## 2017-03-25 LAB — POCT URINALYSIS DIPSTICK
Bilirubin, UA: NEGATIVE
Blood, UA: NEGATIVE
Glucose, UA: NEGATIVE
KETONES UA: NEGATIVE
LEUKOCYTES UA: NEGATIVE
Nitrite, UA: NEGATIVE
PROTEIN UA: NEGATIVE
Urobilinogen, UA: NEGATIVE E.U./dL — AB
pH, UA: 5 (ref 5.0–8.0)

## 2017-03-25 NOTE — Patient Instructions (Addendum)
EXERCISE AND DIET:  We recommended that you start or continue a regular exercise program for good health. Regular exercise means any activity that makes your heart beat faster and makes you sweat.  We recommend exercising at least 30 minutes per day at least 3 days a week, preferably 4 or 5.  We also recommend a diet low in fat and sugar.  Inactivity, poor dietary choices and obesity can cause diabetes, heart attack, stroke, and kidney damage, among others.    ALCOHOL AND SMOKING:  Women should limit their alcohol intake to no more than 7 drinks/beers/glasses of wine (combined, not each!) per week. Moderation of alcohol intake to this level decreases your risk of breast cancer and liver damage. And of course, no recreational drugs are part of a healthy lifestyle.  And absolutely no smoking or even second hand smoke. Most people know smoking can cause heart and lung diseases, but did you know it also contributes to weakening of your bones? Aging of your skin?  Yellowing of your teeth and nails?  CALCIUM AND VITAMIN D:  Adequate intake of calcium and Vitamin D are recommended.  The recommendations for exact amounts of these supplements seem to change often, but generally speaking 600 mg of calcium (either carbonate or citrate) and 800 units of Vitamin D per day seems prudent. Certain women may benefit from higher intake of Vitamin D.  If you are among these women, your doctor will have told you during your visit.    PAP SMEARS:  Pap smears, to check for cervical cancer or precancers,  have traditionally been done yearly, although recent scientific advances have shown that most women can have pap smears less often.  However, every woman still should have a physical exam from her gynecologist every year. It will include a breast check, inspection of the vulva and vagina to check for abnormal growths or skin changes, a visual exam of the cervix, and then an exam to evaluate the size and shape of the uterus and  ovaries.  And after 62 years of age, a rectal exam is indicated to check for rectal cancers. We will also provide age appropriate advice regarding health maintenance, like when you should have certain vaccines, screening for sexually transmitted diseases, bone density testing, colonoscopy, mammograms, etc.   MAMMOGRAMS:  All women over 40 years old should have a yearly mammogram. Many facilities now offer a "3D" mammogram, which may cost around $50 extra out of pocket. If possible,  we recommend you accept the option to have the 3D mammogram performed.  It both reduces the number of women who will be called back for extra views which then turn out to be normal, and it is better than the routine mammogram at detecting truly abnormal areas.    COLONOSCOPY:  Colonoscopy to screen for colon cancer is recommended for all women at age 50.  We know, you hate the idea of the prep.  We agree, BUT, having colon cancer and not knowing it is worse!!  Colon cancer so often starts as a polyp that can be seen and removed at colonscopy, which can quite literally save your life!  And if your first colonoscopy is normal and you have no family history of colon cancer, most women don't have to have it again for 10 years.  Once every ten years, you can do something that may end up saving your life, right?  We will be happy to help you get it scheduled when you are ready.    Be sure to check your insurance coverage so you understand how much it will cost.  It may be covered as a preventative service at no cost, but you should check your particular policy.   ? ?About Rectocele ? ?Overview ? ?A rectocele is a type of hernia which causes different degrees of bulging of the rectal tissues into the vaginal wall.  You may even notice that it presses against the vaginal wall so much that some vaginal tissues droop outside of the opening of your vagina. ? ?Causes of Rectocele ? ?The most common cause is childbirth.  The muscles and ligaments  in the pelvis that hold up and support the female organs and vagina become stretched and weakened during labor and delivery.  The more babies you have, the more the support tissues are stretched and weakened.  Not everyone who has a baby will develop a rectocele.  Some women have stronger supporting tissue in the pelvis and may not have as much of a problem as others.  Women who have a Cesarean section usually do not get rectocele's unless they pushed a long time prior to the cesarean delivery. ? ?Other conditions that can cause a rectocele include chronic constipation, a chronic cough, a lot of heavy lifting, and obesity.  Older women may have this problem because the loss of female hormones causes the vaginal tissue to become weaker. ? ?Symptoms ? ?There may not be any symptoms.  If you do have symptoms, they may include: ?Pelvic pressure in the rectal area ?Protrusion of the lower part of the vagina through the opening of the vagina ?Constipation and trapping of the stool, making it difficult to have a bowel movement.  In severe cases, you may have to press on the lower part of your vagina to help push the stool out of you rectum.  This is called splinting to empty. ? ?Diagnosing Rectocele ? ?Your health care provider will ask about your symptoms and perform a pelvic exam.  S/he will ask you to bear down, pushing like you are having a bowel movement so as to see how far the lower part of the vagina protrudes into the vagina and possible outside of the vagina.  Your provider will also ask you to contract the muscles of your pelvis (like you are stopping the stream in the middle of urinating) to determine the strength of your pelvic muscles.  Your provider may also do a rectal exam. ? ?Treatment Options ? ?If you do not have any symptoms, no treatment may be necessary.  Other treatment options include: ?Pelvic floor exercises: Contracting the muscles in your genital area may help strengthen your muscles and support  the organs.  Be sure to get proper exercise instruction from you physical therapist. ?A pessary (removealbe pelvic support device) sometimes helps rectocele symptoms. ?Surgery: Surgical repair may be necessary. In some cases the uterus may need to be taken out ( a hysterectomy) as well.  There are many types of surgery for pelvic support problems.  Look for physicians who specialize in repair procedures. ? ?You can take care of yourself by: ?Treating and preventing constipation ?Avoiding heavy lifting, and lifting correctly (with your legs, not with you waist or back) ?Treating a chronic cough or bronchitis ?Not smoking ?avoiding too much weight gain ?Doing pelvic floor exercises ? ?? 2007, [employer] Doc.33 ?
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
## 2017-04-11 ENCOUNTER — Other Ambulatory Visit: Payer: Self-pay | Admitting: Family Medicine

## 2017-04-16 ENCOUNTER — Other Ambulatory Visit: Payer: Self-pay | Admitting: Family Medicine

## 2017-04-26 ENCOUNTER — Encounter: Payer: Self-pay | Admitting: Family Medicine

## 2017-04-26 ENCOUNTER — Other Ambulatory Visit: Payer: Self-pay | Admitting: *Deleted

## 2017-04-26 MED ORDER — FLUCONAZOLE 150 MG PO TABS: ORAL_TABLET | ORAL | 0 refills | Status: DC

## 2017-04-26 NOTE — Telephone Encounter (Signed)
Rx done and the pt was notified via Mychart message. 
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
## 2017-06-15 ENCOUNTER — Ambulatory Visit (INDEPENDENT_AMBULATORY_CARE_PROVIDER_SITE_OTHER): Payer: BLUE CROSS/BLUE SHIELD | Admitting: Family Medicine

## 2017-06-15 ENCOUNTER — Ambulatory Visit (INDEPENDENT_AMBULATORY_CARE_PROVIDER_SITE_OTHER)
Admission: RE | Admit: 2017-06-15 | Discharge: 2017-06-15 | Disposition: A | Discharge status: Home / Self Care | Payer: BLUE CROSS/BLUE SHIELD | Source: Ambulatory Visit | Attending: Family Medicine | Admitting: Family Medicine

## 2017-06-15 ENCOUNTER — Encounter: Payer: Self-pay | Admitting: Family Medicine

## 2017-06-15 VITALS — BP 102/70 | HR 72 | Temp 97.6°F | Ht 65.75 in | Wt 248.9 lb

## 2017-06-15 DIAGNOSIS — E119 Type 2 diabetes mellitus without complications: Secondary | ICD-10-CM

## 2017-06-15 DIAGNOSIS — I10 Essential (primary) hypertension: Secondary | ICD-10-CM

## 2017-06-15 DIAGNOSIS — E785 Hyperlipidemia, unspecified: Secondary | ICD-10-CM

## 2017-06-15 DIAGNOSIS — M25532 Pain in left wrist: Secondary | ICD-10-CM | POA: Diagnosis not present

## 2017-06-15 DIAGNOSIS — E038 Other specified hypothyroidism: Secondary | ICD-10-CM

## 2017-06-15 LAB — CBC
HEMATOCRIT: 42.9 % (ref 36.0–46.0)
HEMOGLOBIN: 13.9 g/dL (ref 12.0–15.0)
MCHC: 32.4 g/dL (ref 30.0–36.0)
MCV: 91.4 fl (ref 78.0–100.0)
PLATELETS: 256 10*3/uL (ref 150.0–400.0)
RBC: 4.69 Mil/uL (ref 3.87–5.11)
RDW: 14.5 % (ref 11.5–15.5)
WBC: 6.5 10*3/uL (ref 4.0–10.5)

## 2017-06-15 LAB — LIPID PANEL
CHOL/HDL RATIO: 3
Cholesterol: 157 mg/dL (ref 0–200)
HDL: 49 mg/dL (ref >39.00)
LDL CALC: 79 mg/dL (ref 0–99)
NONHDL: 108.28
Triglycerides: 147 mg/dL (ref 0.0–149.0)
VLDL: 29.4 mg/dL (ref 0.0–40.0)

## 2017-06-15 LAB — TSH: TSH: 1.23 u[IU]/mL (ref 0.35–4.50)

## 2017-06-15 LAB — BASIC METABOLIC PANEL
BUN: 19 mg/dL (ref 6–23)
CHLORIDE: 103 meq/L (ref 96–112)
CO2: 26 mEq/L (ref 19–32)
Calcium: 9.4 mg/dL (ref 8.4–10.5)
Creatinine, Ser: 0.58 mg/dL (ref 0.40–1.20)
GFR: 112.02 mL/min (ref >60.00)
Glucose, Bld: 111 mg/dL — ABNORMAL HIGH (ref 70–99)
POTASSIUM: 4.6 meq/L (ref 3.5–5.1)
SODIUM: 137 meq/L (ref 135–145)

## 2017-06-15 LAB — HEMOGLOBIN A1C: Hgb A1c MFr Bld: 6.1 % (ref 4.6–6.5)

## 2017-06-15 NOTE — Patient Instructions (Addendum)
BEFORE YOU LEAVE: -xray sheet -follow up: Physical exam in December -labs  For the wrist: -get the xray -ice after shifts and is swelling or pain -aleve or ibuprofen as needed -may have sports med eval to look at tendons -try to keep wrist in neutral position and wear brace if possible - wellgate for women wrist splint is one option  Check with your pharmacy regarding the Valsartan Recall and ensure you product is not one of the recalled products. If it is, ensure they provide you with a product not on the Recall List.  We have ordered labs or studies at this visit. It can take up to 1-2 weeks for results and processing. IF results require follow up or explanation, we will call you with instructions. Clinically stable results will be released to your [facility]. If you have not heard from [place] or cannot find your results in [facility] in 2 weeks please contact our office at [phone].  If you are not yet signed up for [facility], please consider signing up.  Advise regular aerobic exercise (at least 150 minutes per week of sweaty exercise) and a healthy diet. Try to eat at least 5-9 servings of vegetables and fruits per day (not corn, potatoes or bananas.) Avoid sweets, red meat, pork, butter, fried foods, fast food, processed food, excessive dairy, eggs and coconut. Replace bad fats with good fats - fish, nuts and seeds, canola oil, olive oil.

## 2017-06-16 ENCOUNTER — Encounter: Payer: Self-pay | Admitting: Family Medicine

## 2017-06-17 ENCOUNTER — Other Ambulatory Visit: Payer: Self-pay | Admitting: *Deleted

## 2017-06-17 ENCOUNTER — Telehealth: Payer: Self-pay | Admitting: Family Medicine

## 2017-06-17 DIAGNOSIS — M8560 Other cyst of bone, unspecified site: Secondary | ICD-10-CM

## 2017-06-17 NOTE — Telephone Encounter (Signed)
See results note. 

## 2017-06-17 NOTE — Telephone Encounter (Signed)
Hannah Parsons pt returned your call state that she did not recognize the # but you can give her a call back.

## 2017-06-18 ENCOUNTER — Ambulatory Visit (INDEPENDENT_AMBULATORY_CARE_PROVIDER_SITE_OTHER): Payer: BLUE CROSS/BLUE SHIELD | Admitting: Sports Medicine

## 2017-06-18 ENCOUNTER — Encounter: Payer: Self-pay | Admitting: Sports Medicine

## 2017-06-18 VITALS — BP 162/82 | HR 68 | Ht 65.75 in | Wt 249.4 lb

## 2017-06-18 DIAGNOSIS — M25812 Other specified joint disorders, left shoulder: Secondary | ICD-10-CM | POA: Insufficient documentation

## 2017-06-18 DIAGNOSIS — M25532 Pain in left wrist: Secondary | ICD-10-CM | POA: Diagnosis not present

## 2017-06-18 DIAGNOSIS — M7542 Impingement syndrome of left shoulder: Secondary | ICD-10-CM | POA: Diagnosis not present

## 2017-06-18 MED ORDER — IBUPROFEN-FAMOTIDINE 800-26.6 MG PO TABS: ORAL_TABLET | ORAL | 2 refills | Status: DC
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
## 2017-06-18 NOTE — Patient Instructions (Addendum)
Keep wearing your wrist brace.   Take the medicine 2-3Xs per day with food. Be sure to pickup if you see an unfamiliar number.  Do the exercises for your shoulder on a 5-6 day per week basis.

## 2017-06-21 ENCOUNTER — Encounter: Payer: Self-pay | Admitting: Sports Medicine

## 2017-06-21 NOTE — Assessment & Plan Note (Signed)
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  Topical versus injection versus systemic treatment discussed and she would like to proceed with Duexis.  We will plan to have her follow-up in 4 weeks to ensure clinical resolution.  She does have some wrist cysts are likely incidental as they are not associated with the area of her pain.  I suspect she does have a moderate degree of underlying degenerative change and we can consider injection if any lack of improvement.  Recommend continuing with the over-the-counter wrist splint she already has

## 2017-06-21 NOTE — Assessment & Plan Note (Signed)
Patient incidentally reports that her upper arm is also been problematic for quite some time.  She feels though favoring the shoulder may have overloaded the wrist.  Exam is consistent with shoulder impingement and discussed options for systemic anti-inflammatories versus directed corticosteroid injections at the shoulder and the wrist.  We will have her begin with scapular stabilization exercises and rotator cuff conditioning exercises as well as start anti-inflammatories and see if this relieves her pain if not further diagnostic evaluation will be indicated.

## 2017-06-22 ENCOUNTER — Encounter: Payer: Self-pay | Admitting: Family Medicine

## 2017-06-29 ENCOUNTER — Encounter: Payer: Self-pay | Admitting: Family Medicine

## 2017-07-08 ENCOUNTER — Ambulatory Visit (INDEPENDENT_AMBULATORY_CARE_PROVIDER_SITE_OTHER): Payer: BLUE CROSS/BLUE SHIELD | Admitting: Sports Medicine

## 2017-07-08 ENCOUNTER — Encounter: Payer: Self-pay | Admitting: Sports Medicine

## 2017-07-08 ENCOUNTER — Ambulatory Visit (INDEPENDENT_AMBULATORY_CARE_PROVIDER_SITE_OTHER): Payer: BLUE CROSS/BLUE SHIELD

## 2017-07-08 VITALS — BP 150/92 | HR 70 | Ht 65.75 in | Wt 248.4 lb

## 2017-07-08 DIAGNOSIS — M5441 Lumbago with sciatica, right side: Secondary | ICD-10-CM

## 2017-07-08 MED ORDER — METHYLPREDNISOLONE ACETATE 80 MG/ML IJ SUSP
80.0000 mg | Freq: Once | INTRAMUSCULAR | Status: AC
  Administered 2017-07-08: 80 mg via INTRAMUSCULAR

## 2017-07-08 MED ORDER — METHYLPREDNISOLONE 4 MG PO TBPK: ORAL_TABLET | ORAL | 0 refills | Status: DC

## 2017-07-08 MED ORDER — KETOROLAC TROMETHAMINE 60 MG/2ML IM SOLN
60.0000 mg | Freq: Once | INTRAMUSCULAR | Status: AC
  Administered 2017-07-08: 60 mg via INTRAMUSCULAR

## 2017-07-08 MED ORDER — GABAPENTIN 300 MG PO CAPS: ORAL_CAPSULE | ORAL | 1 refills | Status: DC

## 2017-07-08 MED ORDER — OXYCODONE-ACETAMINOPHEN 5-325 MG PO TABS: 1.0000 | ORAL_TABLET | Freq: Three times a day (TID) | ORAL | 0 refills | Status: DC | PRN
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
## 2017-07-09 ENCOUNTER — Encounter: Payer: Self-pay | Admitting: Family Medicine

## 2017-07-11 MED ORDER — CYCLOBENZAPRINE HCL 5 MG PO TABS: 5.0000 mg | ORAL_TABLET | Freq: Three times a day (TID) | ORAL | 1 refills | Status: DC | PRN

## 2017-07-12 ENCOUNTER — Encounter: Payer: Self-pay | Admitting: Sports Medicine

## 2017-07-14 ENCOUNTER — Other Ambulatory Visit: Payer: Self-pay

## 2017-07-14 ENCOUNTER — Telehealth: Payer: Self-pay | Admitting: Sports Medicine

## 2017-07-14 ENCOUNTER — Encounter: Payer: Self-pay | Admitting: Sports Medicine

## 2017-07-14 ENCOUNTER — Ambulatory Visit
Admission: RE | Admit: 2017-07-14 | Discharge: 2017-07-14 | Disposition: A | Discharge status: Home / Self Care | Payer: BLUE CROSS/BLUE SHIELD | Source: Ambulatory Visit | Attending: Sports Medicine | Admitting: Sports Medicine

## 2017-07-14 DIAGNOSIS — M5441 Lumbago with sciatica, right side: Secondary | ICD-10-CM

## 2017-07-14 NOTE — Telephone Encounter (Signed)
Pain is high , wants MRI and a call back to discuss.  Ty,  -LL

## 2017-07-14 NOTE — Telephone Encounter (Signed)
Spoke with patient and advised that MRI order has been placed. Advised of MRI protocol. Pt verbalized understanding and will f/u with Dr. Paulla Fore after her MRI.

## 2017-07-15 ENCOUNTER — Ambulatory Visit (INDEPENDENT_AMBULATORY_CARE_PROVIDER_SITE_OTHER): Payer: BLUE CROSS/BLUE SHIELD | Admitting: Sports Medicine

## 2017-07-15 ENCOUNTER — Encounter: Payer: Self-pay | Admitting: Sports Medicine

## 2017-07-15 VITALS — BP 152/92 | HR 79 | Ht 65.75 in | Wt 248.0 lb

## 2017-07-15 DIAGNOSIS — M5441 Lumbago with sciatica, right side: Secondary | ICD-10-CM | POA: Diagnosis not present
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
## 2017-07-16 ENCOUNTER — Ambulatory Visit: Payer: BLUE CROSS/BLUE SHIELD | Admitting: Sports Medicine

## 2017-07-16 ENCOUNTER — Other Ambulatory Visit: Payer: BLUE CROSS/BLUE SHIELD

## 2017-07-19 ENCOUNTER — Telehealth: Payer: Self-pay | Admitting: Sports Medicine

## 2017-07-19 NOTE — Telephone Encounter (Signed)
Paperwork:Short Term Disability   Paperwork received by Trecia Rogers requesting form]: Patient   Individual made aware of 3-5 business day turn around (Y/N): Y   Office form(s) completed and placed with paperwork (Y/N):   Form location: In Dr. Paulla Fore folder in front office

## 2017-07-19 NOTE — Telephone Encounter (Signed)
Form at the front for pick up. Called pt and advised, she will come to the office to pick up form and take it to her job, no need to fax.

## 2017-07-21 ENCOUNTER — Encounter: Payer: Self-pay | Admitting: Sports Medicine

## 2017-07-22 ENCOUNTER — Encounter: Payer: Self-pay | Admitting: Family Medicine

## 2017-07-26 DIAGNOSIS — M5441 Lumbago with sciatica, right side: Secondary | ICD-10-CM | POA: Insufficient documentation

## 2017-07-26 NOTE — Assessment & Plan Note (Addendum)
Likely L5/S1 right-sided radiculitis secondary to underlying degenerative disc disease however no red flag symptoms on exam.  We will begin with conservative measures and can consider further advanced imaging if any progressive features.  Inje with Toradol and Depo-MEDROL today with steroid taper and gabapentin.  Short term follow-up.  Short course pain medication provided after review of the [place] CS database

## 2017-07-27 ENCOUNTER — Ambulatory Visit
Admission: RE | Admit: 2017-07-27 | Discharge: 2017-07-27 | Disposition: A | Discharge status: Home / Self Care | Payer: BLUE CROSS/BLUE SHIELD | Source: Ambulatory Visit | Attending: Sports Medicine | Admitting: Sports Medicine

## 2017-07-27 DIAGNOSIS — M5441 Lumbago with sciatica, right side: Secondary | ICD-10-CM

## 2017-07-27 MED ORDER — METHYLPREDNISOLONE ACETATE 40 MG/ML INJ SUSP (RADIOLOG
120.0000 mg | Freq: Once | INTRAMUSCULAR | Status: AC
  Administered 2017-07-27: 120 mg via EPIDURAL

## 2017-07-27 MED ORDER — IOPAMIDOL (ISOVUE-M 200) INJECTION 41%
1.0000 mL | Freq: Once | INTRAMUSCULAR | Status: AC
  Administered 2017-07-27: 1 mL via EPIDURAL

## 2017-07-27 NOTE — Discharge Instructions (Signed)

## 2017-08-01 ENCOUNTER — Encounter: Payer: Self-pay | Admitting: Cardiovascular Disease

## 2017-08-01 NOTE — Assessment & Plan Note (Signed)
Symptoms do correlate with findings on MRI and an epidural steroid injection would likely be prudent.  We will plan to refer her to [facility] imaging for this to be performed. We will plan to follow-up with her in 2 weeks to ensure clinical resolution.  >50% of this 25 minute visit spent in direct patient counseling and/or coordination of care.  Discussion was focused on education regarding the in discussing the pathoetiology and anticipated clinical course of the above condition as well as treatment options including surgical and nonsurgical interventions, pharmacotherapy and exercises.  Formal therapy will need to be discussed at follow-up visit.

## 2017-08-02 ENCOUNTER — Ambulatory Visit (INDEPENDENT_AMBULATORY_CARE_PROVIDER_SITE_OTHER): Payer: BLUE CROSS/BLUE SHIELD | Admitting: Sports Medicine

## 2017-08-02 ENCOUNTER — Encounter: Payer: Self-pay | Admitting: Sports Medicine

## 2017-08-02 ENCOUNTER — Ambulatory Visit (INDEPENDENT_AMBULATORY_CARE_PROVIDER_SITE_OTHER): Payer: BLUE CROSS/BLUE SHIELD

## 2017-08-02 VITALS — BP 150/80 | HR 75 | Ht 65.0 in | Wt 250.2 lb

## 2017-08-02 DIAGNOSIS — M25571 Pain in right ankle and joints of right foot: Secondary | ICD-10-CM | POA: Diagnosis not present

## 2017-08-02 DIAGNOSIS — M79671 Pain in right foot: Secondary | ICD-10-CM | POA: Diagnosis not present

## 2017-08-02 DIAGNOSIS — M5441 Lumbago with sciatica, right side: Secondary | ICD-10-CM

## 2017-08-02 DIAGNOSIS — M25561 Pain in right knee: Secondary | ICD-10-CM | POA: Diagnosis not present

## 2017-08-02 MED ORDER — OLMESARTAN MEDOXOMIL 40 MG PO TABS: 40.0000 mg | ORAL_TABLET | Freq: Every day | ORAL | 1 refills | Status: DC
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
## 2017-08-02 NOTE — Telephone Encounter (Signed)
Spoke with patient - she felt that valsartan was not controlling her BP as well as the altace previously did.  Unfortunately she had some breathing problems with the altace.    Will have her try olmesartan 40 mg daily. 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  If BP not well controlled she will contact MD.

## 2017-08-03 ENCOUNTER — Ambulatory Visit (INDEPENDENT_AMBULATORY_CARE_PROVIDER_SITE_OTHER): Payer: BLUE CROSS/BLUE SHIELD | Admitting: Physical Therapy

## 2017-08-03 DIAGNOSIS — R2689 Other abnormalities of gait and mobility: Secondary | ICD-10-CM | POA: Diagnosis not present

## 2017-08-03 DIAGNOSIS — M5441 Lumbago with sciatica, right side: Secondary | ICD-10-CM

## 2017-08-03 DIAGNOSIS — M25561 Pain in right knee: Secondary | ICD-10-CM | POA: Diagnosis not present

## 2017-08-03 DIAGNOSIS — R2681 Unsteadiness on feet: Secondary | ICD-10-CM | POA: Diagnosis not present

## 2017-08-03 DIAGNOSIS — M6281 Muscle weakness (generalized): Secondary | ICD-10-CM

## 2017-08-03 NOTE — Patient Instructions (Signed)
Piriformis Stretch    Lying on back, pull right knee toward opposite shoulder. Hold __20-30__ seconds. Repeat __2-3__ times. Do _2-3___ sessions per day.    TARGET BALL:  Turn towards the main section of the store and head towards the cards.  Turn at the cards and head towards the back of the store.  Just past the home goods before you get to the toys you will see an end cap with "party favor" type toys.  The ball is located in one of these bins.  Look for the softball sized ball that lights up.  It should be around $5.'
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
## 2017-08-04 ENCOUNTER — Ambulatory Visit: Payer: BLUE CROSS/BLUE SHIELD | Admitting: Sports Medicine

## 2017-08-04 ENCOUNTER — Telehealth: Payer: Self-pay | Admitting: Sports Medicine

## 2017-08-04 NOTE — Telephone Encounter (Signed)
Patient dropped FMLA paperwork off that she forgot to bring in her previous visits. Paperwork in provider folder. Informed patient it may take a few days for it to process.

## 2017-08-04 NOTE — Telephone Encounter (Signed)
Forms to Dr. [name] to review.  

## 2017-08-04 NOTE — Telephone Encounter (Signed)
Forms are at the front for pick up.

## 2017-08-04 NOTE — Telephone Encounter (Signed)
Forms have been completed.

## 2017-08-05 ENCOUNTER — Ambulatory Visit (INDEPENDENT_AMBULATORY_CARE_PROVIDER_SITE_OTHER): Payer: BLUE CROSS/BLUE SHIELD | Admitting: Physical Therapy

## 2017-08-05 DIAGNOSIS — M25561 Pain in right knee: Secondary | ICD-10-CM | POA: Diagnosis not present

## 2017-08-05 DIAGNOSIS — M5441 Lumbago with sciatica, right side: Secondary | ICD-10-CM

## 2017-08-05 DIAGNOSIS — R2689 Other abnormalities of gait and mobility: Secondary | ICD-10-CM | POA: Diagnosis not present

## 2017-08-05 DIAGNOSIS — M6281 Muscle weakness (generalized): Secondary | ICD-10-CM | POA: Diagnosis not present

## 2017-08-05 DIAGNOSIS — R2681 Unsteadiness on feet: Secondary | ICD-10-CM | POA: Diagnosis not present
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
## 2017-08-05 NOTE — Telephone Encounter (Signed)
Called [phone] and left VM advising that forms are at the front for pick up.

## 2017-08-05 NOTE — Patient Instructions (Signed)
  PELVIC STABILIZATION: Pelvic Tilt (Lying)    Exhaling, pull belly toward spine, tilting pelvis forward. Hold for 5 seconds.  Inhaling, release. Repeat _10__ times. Do _2-3__ times per day.   Bridging    Slowly raise buttocks from floor, keeping stomach tight.  Hold for 5 seconds. Repeat _10___ times per set. Do ___1_ sets per session. Do _2-3___ sessions per day.   Strengthening: Hip Abductor - Resisted    With band looped around both legs above knees, push thighs apart. Repeat _10___ times per set. Do _1__ sets per session. Do __2-3__ sessions per day.   Straight Leg Raise    Tighten stomach and slowly raise locked right leg _6-8___ inches from floor. Repeat _10___ times per set. Do _1___ sets per session. Do __2-3__ sessions per day.

## 2017-08-06 ENCOUNTER — Encounter: Payer: Self-pay | Admitting: Family Medicine

## 2017-08-09 ENCOUNTER — Ambulatory Visit (INDEPENDENT_AMBULATORY_CARE_PROVIDER_SITE_OTHER): Payer: BLUE CROSS/BLUE SHIELD | Admitting: Physical Therapy

## 2017-08-09 ENCOUNTER — Ambulatory Visit (INDEPENDENT_AMBULATORY_CARE_PROVIDER_SITE_OTHER): Payer: BLUE CROSS/BLUE SHIELD | Admitting: *Deleted

## 2017-08-09 DIAGNOSIS — M5441 Lumbago with sciatica, right side: Secondary | ICD-10-CM | POA: Diagnosis not present

## 2017-08-09 DIAGNOSIS — R2689 Other abnormalities of gait and mobility: Secondary | ICD-10-CM | POA: Diagnosis not present

## 2017-08-09 DIAGNOSIS — M6281 Muscle weakness (generalized): Secondary | ICD-10-CM

## 2017-08-09 DIAGNOSIS — Z23 Encounter for immunization: Secondary | ICD-10-CM

## 2017-08-09 DIAGNOSIS — R2681 Unsteadiness on feet: Secondary | ICD-10-CM | POA: Diagnosis not present

## 2017-08-09 DIAGNOSIS — M25561 Pain in right knee: Secondary | ICD-10-CM | POA: Diagnosis not present

## 2017-08-09 NOTE — Patient Instructions (Signed)
Lower Trunk Rotation Stretch    Keeping back flat and feet together, rotate knees to left side. Hold __20-30__ seconds. Repeat __3__ times per set. Do __1__ sets per session. Do __2-3__ sessions per day.  Knee to Chest (Flexion)    Pull knee toward chest. Feel stretch in lower back or buttock area. Breathing deeply, Hold __20-30__ seconds. Repeat with other knee. Repeat __3__ times. Do _2-3___ sessions per day.
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
## 2017-08-11 ENCOUNTER — Telehealth: Payer: Self-pay | Admitting: Sports Medicine

## 2017-08-11 NOTE — Telephone Encounter (Signed)
Patient called and asked to speak to Lea about her workers comp medical records status that has been ongoing since 07/29/17. I advised the patient that this was something that I would be happy to help her with since Lea was unavailable at the time of call.   Patient explained that she was frustrated that the rep from [employer] for her worker's comp, Johny Drilling has been working with trying to get the records sent to [employer] for quite some time.   I advised the patient that we have the original request and [facility] did sent the records to [facility] department on 07/29/17. The patient stated that she spoke to Marcie Bal today who is the rep for the medical records department at the [phone] telephone number and that Marcie Bal stated that "they do not do workers comp and did not know what the patient was referring to." I advised the patient that I would place her on a brief hold and get it all figured out for her.   I called [facility] and spoke to [facility] directly who stated that she attempted to communicate to the patient that the HIM department does not do worker comp medical records and had the number and fax number that I could call ([place]) that would be able to assist.   I called [employer] [phone] (phone) and spoke to [place] and gave her a summary of the call. Vaughan Basta stated that Jeannene Patella [employer] Rep) called her yesterday and has already sent over the request and it is being processed. (I also successfully faxed the request to Vaughan Basta to the fax numbers [phone] and [phone]). Vaughan Basta agreed to speak to the patient to give her an update so that she was aware of the status of her records. I transferred the call to [facility].

## 2017-08-12 ENCOUNTER — Telehealth: Payer: Self-pay | Admitting: Sports Medicine

## 2017-08-12 ENCOUNTER — Ambulatory Visit (INDEPENDENT_AMBULATORY_CARE_PROVIDER_SITE_OTHER): Payer: BLUE CROSS/BLUE SHIELD | Admitting: Physical Therapy

## 2017-08-12 DIAGNOSIS — R2689 Other abnormalities of gait and mobility: Secondary | ICD-10-CM | POA: Diagnosis not present

## 2017-08-12 DIAGNOSIS — M25561 Pain in right knee: Secondary | ICD-10-CM

## 2017-08-12 DIAGNOSIS — M6281 Muscle weakness (generalized): Secondary | ICD-10-CM | POA: Diagnosis not present

## 2017-08-12 DIAGNOSIS — M5441 Lumbago with sciatica, right side: Secondary | ICD-10-CM | POA: Diagnosis not present

## 2017-08-12 DIAGNOSIS — R2681 Unsteadiness on feet: Secondary | ICD-10-CM

## 2017-08-12 NOTE — Telephone Encounter (Signed)
Paperwork: Short Term disability   Paperwork received by [employer] requesting form]:  [place]  Individual made aware of 3-5 business day turn around (Y/N): Y  Office form(s) completed and placed with paperwork (Y/N): Y  Form location:  Dr. Nicolasa Ducking pick up folder in front office
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
## 2017-08-12 NOTE — Telephone Encounter (Signed)
Form to Dr. Paulla Fore to review and sign.

## 2017-08-13 NOTE — Telephone Encounter (Signed)
Patient calling to check the status of the notes below. I advised her that we did close early yesterday and there is a 3-5 days processing for all paperwork. Patient would like a call by the end of the day with an update regardless of the status. Call patient to advise.

## 2017-08-13 NOTE — Telephone Encounter (Signed)
Called and left VM for pt to call the office. Forms are at the front desks for pick up.

## 2017-08-16 ENCOUNTER — Ambulatory Visit (INDEPENDENT_AMBULATORY_CARE_PROVIDER_SITE_OTHER): Payer: BLUE CROSS/BLUE SHIELD | Admitting: Physical Therapy

## 2017-08-16 DIAGNOSIS — R2681 Unsteadiness on feet: Secondary | ICD-10-CM | POA: Diagnosis not present

## 2017-08-16 DIAGNOSIS — R2689 Other abnormalities of gait and mobility: Secondary | ICD-10-CM | POA: Diagnosis not present

## 2017-08-16 DIAGNOSIS — M6281 Muscle weakness (generalized): Secondary | ICD-10-CM

## 2017-08-16 DIAGNOSIS — M5441 Lumbago with sciatica, right side: Secondary | ICD-10-CM

## 2017-08-16 DIAGNOSIS — M25561 Pain in right knee: Secondary | ICD-10-CM
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
## 2017-08-19 ENCOUNTER — Ambulatory Visit (INDEPENDENT_AMBULATORY_CARE_PROVIDER_SITE_OTHER): Payer: BLUE CROSS/BLUE SHIELD | Admitting: Physical Therapy

## 2017-08-19 DIAGNOSIS — R2689 Other abnormalities of gait and mobility: Secondary | ICD-10-CM

## 2017-08-19 DIAGNOSIS — M6281 Muscle weakness (generalized): Secondary | ICD-10-CM

## 2017-08-19 DIAGNOSIS — M25561 Pain in right knee: Secondary | ICD-10-CM | POA: Diagnosis not present

## 2017-08-19 DIAGNOSIS — M5441 Lumbago with sciatica, right side: Secondary | ICD-10-CM

## 2017-08-19 DIAGNOSIS — R2681 Unsteadiness on feet: Secondary | ICD-10-CM | POA: Diagnosis not present
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
## 2017-08-23 ENCOUNTER — Ambulatory Visit (INDEPENDENT_AMBULATORY_CARE_PROVIDER_SITE_OTHER): Payer: BLUE CROSS/BLUE SHIELD | Admitting: Physical Therapy

## 2017-08-23 DIAGNOSIS — R2681 Unsteadiness on feet: Secondary | ICD-10-CM

## 2017-08-23 DIAGNOSIS — M5441 Lumbago with sciatica, right side: Secondary | ICD-10-CM

## 2017-08-23 DIAGNOSIS — R2689 Other abnormalities of gait and mobility: Secondary | ICD-10-CM

## 2017-08-23 DIAGNOSIS — M25561 Pain in right knee: Secondary | ICD-10-CM

## 2017-08-23 DIAGNOSIS — M6281 Muscle weakness (generalized): Secondary | ICD-10-CM
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
## 2017-08-24 ENCOUNTER — Encounter: Payer: Self-pay | Admitting: Cardiovascular Disease

## 2017-08-25 ENCOUNTER — Other Ambulatory Visit: Payer: Self-pay | Admitting: *Deleted

## 2017-08-25 DIAGNOSIS — M25561 Pain in right knee: Secondary | ICD-10-CM | POA: Insufficient documentation

## 2017-08-25 DIAGNOSIS — M25571 Pain in right ankle and joints of right foot: Secondary | ICD-10-CM | POA: Insufficient documentation

## 2017-08-25 NOTE — Assessment & Plan Note (Signed)
Likely ankle sprain, no evidence of fracture on x-ray

## 2017-08-25 NOTE — Assessment & Plan Note (Signed)
Likely ankle sprain, no evidence of bony abnormality on x-ray.  No significant effusion.  Anterior contusion consistent with patellar tendon contusion.

## 2017-08-25 NOTE — Assessment & Plan Note (Signed)
Known degenerative spine disease.  She has benefited from epidural steroid injection but is obviously complicated situation with this most recent fall.  We will plan to have her follow-up in 4 weeks to ensure clinical improvement and have her continue working with physical therapy.  She reports this most recent fall was purely mechanical in nature and not associated with weakness or neuropathic etiology.

## 2017-08-26 ENCOUNTER — Ambulatory Visit (INDEPENDENT_AMBULATORY_CARE_PROVIDER_SITE_OTHER): Payer: BLUE CROSS/BLUE SHIELD | Admitting: Physical Therapy

## 2017-08-26 DIAGNOSIS — R2689 Other abnormalities of gait and mobility: Secondary | ICD-10-CM

## 2017-08-26 DIAGNOSIS — M25561 Pain in right knee: Secondary | ICD-10-CM | POA: Diagnosis not present

## 2017-08-26 DIAGNOSIS — M6281 Muscle weakness (generalized): Secondary | ICD-10-CM | POA: Diagnosis not present

## 2017-08-26 DIAGNOSIS — M5441 Lumbago with sciatica, right side: Secondary | ICD-10-CM | POA: Diagnosis not present

## 2017-08-26 DIAGNOSIS — R2681 Unsteadiness on feet: Secondary | ICD-10-CM

## 2017-08-26 MED ORDER — HYDROCHLOROTHIAZIDE 25 MG PO TABS: 25.0000 mg | ORAL_TABLET | Freq: Every day | ORAL | 11 refills | Status: DC
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
## 2017-08-30 ENCOUNTER — Ambulatory Visit (INDEPENDENT_AMBULATORY_CARE_PROVIDER_SITE_OTHER): Payer: BLUE CROSS/BLUE SHIELD | Admitting: Sports Medicine

## 2017-08-30 ENCOUNTER — Encounter: Payer: Self-pay | Admitting: Sports Medicine

## 2017-08-30 VITALS — BP 140/88 | HR 73 | Ht 65.0 in | Wt 252.2 lb

## 2017-08-30 DIAGNOSIS — M5441 Lumbago with sciatica, right side: Secondary | ICD-10-CM | POA: Diagnosis not present
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
## 2017-08-30 NOTE — Assessment & Plan Note (Signed)
>  50% of this 25 minute visit spent in direct patient counseling and/or coordination of care.  Discussion was focused on education regarding the in discussing the pathoetiology and anticipated clinical course of the above condition.  Ultimately her symptoms are concerning for potential true radiculopathic weakness.  She has had some improvement in her symptoms but given the worsening giving way of her leg that is seemingly associated with the L3 distribution I would like to ensure that there is no significant neurogenic cause to her symptoms.  I suspect some underlying structural etiology of her knee however need to rule out neurogenic causes before continuing with conservative care.  If there does appear to be radiculopathic changes neurosurgical consultation will be indicated.  Follow-up after EMG/nerve conduction studies to discuss further management.  If overall reassuring findings will have her continue with therapeutic exercises, aggressive physical therapy and consider further diagnostic evaluation of her knee with MRI.

## 2017-08-31 ENCOUNTER — Other Ambulatory Visit: Payer: Self-pay | Admitting: Sports Medicine

## 2017-08-31 DIAGNOSIS — M5441 Lumbago with sciatica, right side: Secondary | ICD-10-CM

## 2017-09-01 ENCOUNTER — Encounter: Payer: Self-pay | Admitting: Neurology

## 2017-09-08 ENCOUNTER — Encounter: Payer: Self-pay | Admitting: Sports Medicine

## 2017-09-10 ENCOUNTER — Other Ambulatory Visit: Payer: Self-pay | Admitting: Family Medicine

## 2017-09-17 ENCOUNTER — Other Ambulatory Visit: Payer: Self-pay | Admitting: *Deleted

## 2017-09-17 MED ORDER — OMEPRAZOLE 40 MG PO CPDR: 40.0000 mg | DELAYED_RELEASE_CAPSULE | Freq: Every day | ORAL | 1 refills | Status: DC

## 2017-09-17 NOTE — Telephone Encounter (Signed)
Rx done. 

## 2017-09-20 ENCOUNTER — Other Ambulatory Visit: Payer: Self-pay | Admitting: Family Medicine

## 2017-09-20 ENCOUNTER — Other Ambulatory Visit: Payer: Self-pay | Admitting: *Deleted

## 2017-09-20 DIAGNOSIS — R200 Anesthesia of skin: Secondary | ICD-10-CM

## 2017-09-21 ENCOUNTER — Ambulatory Visit (INDEPENDENT_AMBULATORY_CARE_PROVIDER_SITE_OTHER): Payer: BLUE CROSS/BLUE SHIELD | Admitting: Neurology

## 2017-09-21 DIAGNOSIS — M5417 Radiculopathy, lumbosacral region: Secondary | ICD-10-CM

## 2017-09-21 DIAGNOSIS — R200 Anesthesia of skin: Secondary | ICD-10-CM

## 2017-09-21 NOTE — Procedures (Signed)
[facility]  [place], [place]  [place], [place] [address] Tel: [phone] Fax:  [phone] Test Date:  09/21/2017  Patient: Hannah Parsons DOB: 02/10/1956 Physician: Narda Amber, DO  Sex: Female Height: 5\' 5"  Ref Phys: Teresa Coombs, DO  ID#: [id number] Temp: 32.6C Technician:    Patient Complaints: This is a 61 year old female referred for evaluation of low back pain and right leg weakness.  NCV & EMG Findings: Extensive electrodiagnostic testing of the right lower extremity shows:  1. Right sural and superficial peroneal sensory responses within normal limits. 2. Right peroneal and tibial motor responses are within normal limits. 3. Right tibial H reflex study is within normal limits. 4. Active on chronic motor axon loss changes are seen affecting the right rectus femoris, abductor longus, and iliopsoas muscles.  Fibrillation potentials are present in the lower lumbar paraspinal muscles.  Impression: 1. Subacute L3 radiculopathy affecting the right lower extremity; moderate in degree electrically. 2. There is no evidence of a sensorimotor polyneuropathy affecting the right leg.   ___________________________ Narda Amber, DO    Nerve Conduction Studies Anti Sensory Summary Table   Site NR Peak (ms) Norm Peak (ms) P-T Amp (V) Norm P-T Amp  Right Sup Peroneal Anti Sensory (Ant Lat Mall)  32.6C  12 cm    3.2 <4.6 10.1 >3  Right Sural Anti Sensory (Lat Mall)  32.6C  Calf    3.4 <4.6 13.2 >3   Motor Summary Table   Site NR Onset (ms) Norm Onset (ms) O-P Amp (mV) Norm O-P Amp Site1 Site2 Delta-0 (ms) Dist (cm) Vel (m/s) Norm Vel (m/s)  Right Peroneal Motor (Ext Dig Brev)  32.6C  Ankle    3.9 <6.0 5.1 >2.5 B Fib Ankle 7.5 37.0 49 >40  B Fib    11.4  4.8  Poplt B Fib 1.2 7.0 58 >40  Poplt    12.6  4.6         Right Tibial Motor (Abd Hall Brev)  32.6C  Ankle    2.5 <6.0 10.7 >4 Knee Ankle 9.6 38.0 40 >40  Knee    12.1  8.5          H Reflex  Studies   NR H-Lat (ms) Lat Norm (ms) L-R H-Lat (ms)  Right Tibial (Gastroc)  32.6C     32.79 <35    EMG   Side Muscle Ins Act Fibs Psw Fasc Number Recrt Dur Dur. Amp Amp. Poly Poly. Comment  Right AntTibialis Nml Nml Nml Nml Nml Nml Nml Nml Nml Nml Nml Nml N/A  Right Gastroc Nml Nml Nml Nml Nml Nml Nml Nml Nml Nml Nml Nml N/A  Right Flex Dig Long Nml Nml Nml Nml Nml Nml Nml Nml Nml Nml Nml Nml N/A  Right RectFemoris Nml 1+ Nml Nml 2- Rapid Many 1+ Many 1+ Nml Nml N/A  Right Iliopsoas Nml Nml Nml Nml 1- Rapid Some 1+ Some 1+ Nml Nml N/A  Right AdductorLong Nml 1+ Nml Nml 2- Rapid Some 1+ Some 1+ Nml Nml N/A  Right Lumbo Parasp Low Nml 1+ Nml Nml NE - - - - - - - N/A  Right GluteusMed Nml Nml Nml Nml Nml Nml Nml Nml Nml Nml Nml Nml N/A      Waveforms:

## 2017-09-23 ENCOUNTER — Other Ambulatory Visit: Payer: Self-pay | Admitting: Cardiovascular Disease

## 2017-09-27 ENCOUNTER — Encounter: Payer: Self-pay | Admitting: Sports Medicine

## 2017-09-27 ENCOUNTER — Ambulatory Visit (INDEPENDENT_AMBULATORY_CARE_PROVIDER_SITE_OTHER): Payer: BLUE CROSS/BLUE SHIELD | Admitting: Sports Medicine

## 2017-09-27 ENCOUNTER — Other Ambulatory Visit: Payer: Self-pay | Admitting: Cardiovascular Disease

## 2017-09-27 ENCOUNTER — Telehealth: Payer: Self-pay | Admitting: Sports Medicine

## 2017-09-27 VITALS — BP 132/82 | HR 80 | Ht 65.0 in | Wt 258.4 lb

## 2017-09-27 DIAGNOSIS — E119 Type 2 diabetes mellitus without complications: Secondary | ICD-10-CM

## 2017-09-27 DIAGNOSIS — M5441 Lumbago with sciatica, right side: Secondary | ICD-10-CM

## 2017-09-27 DIAGNOSIS — S92514A Nondisplaced fracture of proximal phalanx of right lesser toe(s), initial encounter for closed fracture: Secondary | ICD-10-CM

## 2017-09-27 DIAGNOSIS — S92514D Nondisplaced fracture of proximal phalanx of right lesser toe(s), subsequent encounter for fracture with routine healing: Secondary | ICD-10-CM

## 2017-09-27 DIAGNOSIS — M5416 Radiculopathy, lumbar region: Secondary | ICD-10-CM

## 2017-09-27 HISTORY — DX: Nondisplaced fracture of proximal phalanx of right lesser toe(s), initial encounter for closed fracture: S92.514A

## 2017-09-27 MED ORDER — DILTIAZEM HCL ER COATED BEADS 120 MG PO CP24
120.0000 mg | ORAL_CAPSULE | Freq: Every day | ORAL | 0 refills | Status: DC
Start: 2017-09-27 — End: 2017-10-25

## 2017-09-27 NOTE — Assessment & Plan Note (Signed)
Cautious with steroid dose

## 2017-09-27 NOTE — Progress Notes (Signed)
Hannah Parsons. Hannah Parsons, [place] at [facility] [place] - 61 y.o. female MRN [id number]  Date of birth: Jun 26, 1956 Scribe for today's visit: Hannah Parsons, CMA    SUBJECTIVE:  Hannah Parsons is here for evaluation of Follow-up (RT-sided lower back pain with RT-sided sciatica)  Hannah Parsons is an established pt presenting today for f/u of her LBP and R LE sciatica.  Pt was last seen on 07/15/17 to discuss her lumbar MRI results.  Pt states that she went to PT x 3 weeks and that went well.  Pt states that she is going to start some yoga.  She notes that she is now able to move w/o pain and w/o handicap but notes that she does have some odd discomfort in her R knee.  She states that her R LE does not feel or work the same as the L.  She notes that she has pain about 50% of the time in her R knee and has decreased R knee ROM.  She states that she wore her R knee brace up until about a week ago when she tightened it extra tight because she was outside at a skeet shooting event and feels like this bothered her knee.  She states that she feels like she's ok w/ walking but that she will have some intermittent buckling of her R knee.  She has some intermittent, transient back pain and notes that the location varies from R, L and midline.  She also notes that she was having some memory difficulty and feels like this may be related to the gabepentin.  She states that she decreased her gabepentin dose from 3x/day to 2x/day.  She states that her main concern is determining the root-cause of her R knee pain to ensure that she doesn't do anything to make it worse.   Compared to the last office visit, her previously described symptoms are improving , she feels stronger than she did at her last visit. She no longer has radiation of pain into lower extremities.  Current symptoms are mild & are nonradiating In the past she has tried PT, epidural inj, Gabapentin,  Medrol Dosepak, Oxycodone, and Ketorolac and Depo Medrol IM injections. She has NCV w/ EMG with Dr. Posey Parsons 09/21/2017.    ROS Denies night time disturbances. Denies fevers, chills, or night sweats. Denies unexplained weight loss. Denies personal history of cancer. Denies changes in bowel or bladder habits. Denies recent unreported falls. Denies new or worsening dyspnea or wheezing. Denies headaches or dizziness.  Denies numbness, tingling or weakness  In the extremities.  Denies dizziness or presyncopal episodes Denies lower extremity edema    HISTORY & PERTINENT PRIOR DATA:  Prior History reviewed and updated per electronic medical record. Significant history, findings, studies and interim changes include: Findings:  NCV w/ EMG, Dr. Posey Parsons, 09/21/2017 -subacute L3 radiculopathy, moderate with active on chronic motor axonal loss MRI L-spine 07/14/2017-see overview   reports that  has never smoked. she has never used smokeless tobacco. Recent Labs    10/06/16 1117 06/15/17 1049  HGBA1C 6.0 6.1   Problem  Closed Nondisplaced Fracture of Proximal Phalanx of Lesser Toe of Right Foot  Radiculopathy of Lumbar Region   MRI lumbar spine 07/14/2017 with multilevel degenerative changes most notably at L3-L4 Nerve conduction studies and EMGs on 09/21/2017 shows subacute L3 radiculopathy, moderate in degree with both active on chronic motor axonal loss changes.     Acute  Right-Sided Low Back Pain With Right-Sided Sciatica  Diabetes ([facility])     OBJECTIVE:  VS:  HT:5\' 5"  (165.1 cm)   WT:258 lb 6.4 oz (117.2 kg)  BMI:43    BP:132/82  HR:80bpm  TEMP: ( )  RESP:98 %  PHYSICAL EXAM: Constitutional: WDWN, Non-toxic appearing. Psychiatric: Alert & appropriately interactive.Not depressed or anxious appearing. Respiratory: No increased work of breathing. Trachea Midline Eyes: Pupils are equal. EOM intact without nystagmus. No scleral icterus  Bilateral lower extremity NEUROVASCULAR  exam: No clubbing or cyanosis appreciated No significant pretibial/lower extremity peripheral edema No significant venous stasis changes Capillary Refill: normal, less than 2 seconds   Pedal Pulses: symmetrically palpable  Sensation in examined extremities: Intact to light touch in all dermatomes  Back and legs: She has negative straight leg raise bilaterally but does have a persistently tight hamstrings.  No true radicular symptoms with this.  Right knee is 4 out of 5 strength with extension, 5 out of 5 with flexion.  Dorsiflexors, EHL and plantar flexor strength is 5 out of 5 diffusely bilaterally. Right fifth toe has a small amount of erythema over the MTP with limited flexion and extension that is mild.  Small amount of focal TTP directly over the MTP joint.  ASSESSMENT & PLAN:   1. Acute right-sided low back pain with right-sided sciatica   2. Closed nondisplaced fracture of proximal phalanx of lesser toe of right foot with routine healing, subsequent encounter   3. Type 2 diabetes mellitus without complication, without long-term current use of insulin (HCC)   4. Radiculopathy of lumbar region    Plan:    Radiculopathy of lumbar region Long discussion regarding referral to neurosurgery versus continued conservative measures and patient understands that there are risks of long-term nerve damage without decompression.  She would like to try one additional injection and given that she is showing good improvement in her pain and as well as her weakness I am agreeable to this but if any worsening symptoms will need neurosurgical consultation.  Diabetes ([place]) Cautious with steroid dose  Closed nondisplaced fracture of proximal phalanx of lesser toe of right foot This is healing well.  There is a slight cortical irregularity along the base of the fifth toe and she is still mildly tender in this area consistent with nondisplaced fracture.  Continue with avoiding exacerbating activities.  No  other restrictions at this time.  Acute right-sided low back pain with right-sided sciatica Patient has been on limited work.  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  If any exacerbation will need neurosurgical evaluation but the risk of recurrent issues is minimal swelling she does not have any significant straining injuries.   ++++++++++++++++++++++++++++++++++++++++++++ Orders:  Orders Placed This Encounter  Procedures  . DG INJECT DIAG/THERA/INC NEEDLE/CATH/PLC EPI/LUMB/SAC W/IMG    Meds:  No orders of the defined types were placed in this encounter.   ++++++++++++++++++++++++++++++++++++++++++++ Follow-up: Return in about 8 weeks (around 11/22/2017).   Pertinent documentation may be included in additional procedure notes, imaging studies, problem based documentation and patient instructions. Please see these sections of the encounter for additional information regarding this visit. CMA/ATC served as Education administrator during this visit. History, Physical, and Plan performed by medical provider. Documentation and orders reviewed and attested to.      August Albino Sports Medicine Physician    09/27/2017 2:09 PM

## 2017-09-27 NOTE — Assessment & Plan Note (Signed)
This is healing well.  There is a slight cortical irregularity along the base of the fifth toe and she is still mildly tender in this area consistent with nondisplaced fracture.  Continue with avoiding exacerbating activities.  No other restrictions at this time.

## 2017-09-27 NOTE — Assessment & Plan Note (Addendum)
Long discussion regarding referral to neurosurgery versus continued conservative measures and patient understands that there are risks of long-term nerve damage without decompression.  She would like to try one additional injection and given that she is showing good improvement in her pain and as well as her weakness I am agreeable to this but if any worsening symptoms will need neurosurgical consultation.

## 2017-09-27 NOTE — Assessment & Plan Note (Deleted)
Long discussion regarding referral to neurosurgery versus continued conservative measures and patient understands that there are risks of long-term nerve damage without decompression.  She would like to try one additional injection and given that she is showing good improvement in her pain and as well as her weakness I am agreeable to this but if any worsening symptoms will need neurosurgical consultation.

## 2017-09-27 NOTE — Telephone Encounter (Signed)
Noted - see disability forms.

## 2017-09-27 NOTE — Telephone Encounter (Signed)
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

## 2017-09-27 NOTE — Telephone Encounter (Signed)
Forms to Dr. Paulla Fore to review and sign.      Small, Hannah Parsons 1 hour ago (2:40 PM)      Patient called in reference to information for work release note. Patient stated note does NOT need to have lifting restrictions. Please advise.

## 2017-09-27 NOTE — Patient Instructions (Addendum)
We will plan to have you go back to 4 days per week Call and let me know regarding lifting restrictions We are going to refer you back for another epidural injection. If you are not improving we will need to send you for surgical consultation    2. Closed nondisplaced fracture of proximal phalanx of lesser toe of right foot with routine healing, subsequent encounter S92.514D    Occured on 07/26/2017

## 2017-09-27 NOTE — Assessment & Plan Note (Signed)
Patient has been on limited work.  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  If any exacerbation will need neurosurgical evaluation but the risk of recurrent issues is minimal swelling she does not have any significant straining injuries.

## 2017-09-27 NOTE — Telephone Encounter (Signed)
Addressed during office visit.  ICD 10 code provided for fifth toe fracture, nondisplaced

## 2017-09-27 NOTE — Telephone Encounter (Signed)
Patient called in reference to information for work release note. Patient stated note does NOT need to have lifting restrictions. Please advise.

## 2017-09-28 ENCOUNTER — Encounter: Payer: Self-pay | Admitting: Sports Medicine

## 2017-09-28 NOTE — Telephone Encounter (Signed)
Duplicate encounter regarding pt disability forms.

## 2017-09-28 NOTE — Telephone Encounter (Signed)
Pt called in to follow up on disability paperwork. Pt says that she is ready to return to work. Please advise when ready for pick up.

## 2017-09-28 NOTE — Telephone Encounter (Signed)
Forms at the front for pick up, called pt and advised.

## 2017-09-28 NOTE — Telephone Encounter (Signed)
Completed and returned to [employer].  Anticipated date of full return now 12/05/2017.  Okay to return to work on 10/04/2018 to limited duty.

## 2017-10-05 ENCOUNTER — Ambulatory Visit
Admission: RE | Admit: 2017-10-05 | Discharge: 2017-10-05 | Disposition: A | Discharge status: Home / Self Care | Payer: BLUE CROSS/BLUE SHIELD | Source: Ambulatory Visit | Attending: Sports Medicine | Admitting: Sports Medicine

## 2017-10-05 DIAGNOSIS — M5441 Lumbago with sciatica, right side: Secondary | ICD-10-CM

## 2017-10-05 DIAGNOSIS — E119 Type 2 diabetes mellitus without complications: Secondary | ICD-10-CM

## 2017-10-05 MED ORDER — IOPAMIDOL (ISOVUE-M 200) INJECTION 41%
1.0000 mL | Freq: Once | INTRAMUSCULAR | Status: AC
  Administered 2017-10-05: 1 mL via EPIDURAL

## 2017-10-05 MED ORDER — METHYLPREDNISOLONE ACETATE 40 MG/ML INJ SUSP (RADIOLOG
120.0000 mg | Freq: Once | INTRAMUSCULAR | Status: AC
  Administered 2017-10-05: 120 mg via EPIDURAL

## 2017-10-05 NOTE — Discharge Instructions (Signed)

## 2017-10-13 ENCOUNTER — Other Ambulatory Visit: Payer: Self-pay | Admitting: Sports Medicine

## 2017-10-13 DIAGNOSIS — M5441 Lumbago with sciatica, right side: Secondary | ICD-10-CM

## 2017-10-22 ENCOUNTER — Encounter: Payer: BLUE CROSS/BLUE SHIELD | Admitting: Family Medicine

## 2017-10-25 ENCOUNTER — Other Ambulatory Visit: Payer: Self-pay | Admitting: Cardiovascular Disease

## 2017-11-09 ENCOUNTER — Ambulatory Visit: Payer: BLUE CROSS/BLUE SHIELD | Admitting: Family Medicine

## 2017-11-09 ENCOUNTER — Encounter: Payer: Self-pay | Admitting: Family Medicine

## 2017-11-09 VITALS — BP 118/80 | HR 81 | Temp 98.3°F | Ht 66.0 in | Wt 256.4 lb

## 2017-11-09 DIAGNOSIS — Z0000 Encounter for general adult medical examination without abnormal findings: Secondary | ICD-10-CM | POA: Diagnosis not present

## 2017-11-09 DIAGNOSIS — Z1331 Encounter for screening for depression: Secondary | ICD-10-CM | POA: Diagnosis not present

## 2017-11-09 DIAGNOSIS — E038 Other specified hypothyroidism: Secondary | ICD-10-CM | POA: Diagnosis not present

## 2017-11-09 DIAGNOSIS — E119 Type 2 diabetes mellitus without complications: Secondary | ICD-10-CM | POA: Diagnosis not present

## 2017-11-09 DIAGNOSIS — Z23 Encounter for immunization: Secondary | ICD-10-CM

## 2017-11-09 DIAGNOSIS — E1159 Type 2 diabetes mellitus with other circulatory complications: Secondary | ICD-10-CM | POA: Diagnosis not present

## 2017-11-09 DIAGNOSIS — M5416 Radiculopathy, lumbar region: Secondary | ICD-10-CM | POA: Diagnosis not present

## 2017-11-09 DIAGNOSIS — I10 Essential (primary) hypertension: Secondary | ICD-10-CM

## 2017-11-09 DIAGNOSIS — I152 Hypertension secondary to endocrine disorders: Secondary | ICD-10-CM
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
## 2017-11-09 NOTE — Patient Instructions (Signed)
BEFORE YOU LEAVE: -ppsv 23 -schedule fasting lab visit in next 1 week -follow up: 3-4 months  Please schedule mammogram in March.   Preventive Care 40-64 Years, Female Preventive care refers to lifestyle choices and visits with your health care provider that can promote health and wellness. What does preventive care include?  A yearly physical exam. This is also called an annual well check.  Dental exams once or twice a year.  Routine eye exams. Ask your health care provider how often you should have your eyes checked.  Personal lifestyle choices, including: ? Daily care of your teeth and gums. ? Regular physical activity. ? Eating a healthy diet. ? Avoiding tobacco and drug use. ? Limiting alcohol use. ? Practicing safe sex. ? Taking low-dose aspirin daily starting at age 50. ? Taking vitamin and mineral supplements as recommended by your health care provider. What happens during an annual well check? The services and screenings done by your health care provider during your annual well check will depend on your age, overall health, lifestyle risk factors, and family history of disease. Counseling Your health care provider may ask you questions about your:  Alcohol use.  Tobacco use.  Drug use.  Emotional well-being.  Home and relationship well-being.  Sexual activity.  Eating habits.  Work and work Statistician.  Method of birth control.  Menstrual cycle.  Pregnancy history.  Screening You may have the following tests or measurements:  Height, weight, and BMI.  Blood pressure.  Lipid and cholesterol levels. These may be checked every 5 years, or more frequently if you are over 24 years old.  Skin check.  Lung cancer screening. You may have this screening every year starting at age 16 if you have a 30-pack-year history of smoking and currently smoke or have quit within the past 15 years.  Fecal occult blood test (FOBT) of the stool. You may have this  test every year starting at age 3.  Flexible sigmoidoscopy or colonoscopy. You may have a sigmoidoscopy every 5 years or a colonoscopy every 10 years starting at age 3.  Hepatitis C blood test.  Hepatitis B blood test.  Sexually transmitted disease (STD) testing.  Diabetes screening. This is done by checking your blood sugar (glucose) after you have not eaten for a while (fasting). You may have this done every 1-3 years.  Mammogram. This may be done every 1-2 years. Talk to your health care provider about when you should start having regular mammograms. This may depend on whether you have a family history of breast cancer.  BRCA-related cancer screening. This may be done if you have a family history of breast, ovarian, tubal, or peritoneal cancers.  Pelvic exam and Pap test. This may be done every 3 years starting at age 22. Starting at age 68, this may be done every 5 years if you have a Pap test in combination with an HPV test.  Bone density scan. This is done to screen for osteoporosis. You may have this scan if you are at high risk for osteoporosis.  Discuss your test results, treatment options, and if necessary, the need for more tests with your health care provider. Vaccines Your health care provider may recommend certain vaccines, such as:  Influenza vaccine. This is recommended every year.  Tetanus, diphtheria, and acellular pertussis (Tdap, Td) vaccine. You may need a Td booster every 10 years.  Varicella vaccine. You may need this if you have not been vaccinated.  Zoster vaccine. You may need  this after age 97.  Measles, mumps, and rubella (MMR) vaccine. You may need at least one dose of MMR if you were born in 1957 or later. You may also need a second dose.  Pneumococcal 13-valent conjugate (PCV13) vaccine. You may need this if you have certain conditions and were not previously vaccinated.  Pneumococcal polysaccharide (PPSV23) vaccine. You may need one or two doses  if you smoke cigarettes or if you have certain conditions.  Meningococcal vaccine. You may need this if you have certain conditions.  Hepatitis A vaccine. You may need this if you have certain conditions or if you travel or work in places where you may be exposed to hepatitis A.  Hepatitis B vaccine. You may need this if you have certain conditions or if you travel or work in places where you may be exposed to hepatitis B.  Haemophilus influenzae type b (Hib) vaccine. You may need this if you have certain conditions.  Talk to your health care provider about which screenings and vaccines you need and how often you need them. This information is not intended to replace advice given to you by your health care provider. Make sure you discuss any questions you have with your health care provider. Document Released: 11/15/2015 Document Revised: 07/08/2016 Document Reviewed: 08/20/2015 Elsevier Interactive Patient Education  [employer].

## 2017-11-11 ENCOUNTER — Other Ambulatory Visit (INDEPENDENT_AMBULATORY_CARE_PROVIDER_SITE_OTHER): Payer: BLUE CROSS/BLUE SHIELD

## 2017-11-11 ENCOUNTER — Encounter: Payer: Self-pay | Admitting: Family Medicine

## 2017-11-11 DIAGNOSIS — E1159 Type 2 diabetes mellitus with other circulatory complications: Secondary | ICD-10-CM | POA: Diagnosis not present

## 2017-11-11 DIAGNOSIS — I10 Essential (primary) hypertension: Secondary | ICD-10-CM

## 2017-11-11 DIAGNOSIS — I152 Hypertension secondary to endocrine disorders: Secondary | ICD-10-CM

## 2017-11-11 DIAGNOSIS — E119 Type 2 diabetes mellitus without complications: Secondary | ICD-10-CM | POA: Diagnosis not present

## 2017-11-11 DIAGNOSIS — E038 Other specified hypothyroidism: Secondary | ICD-10-CM | POA: Diagnosis not present

## 2017-11-11 LAB — CBC
HEMATOCRIT: 38.7 % (ref 36.0–46.0)
Hemoglobin: 12.6 g/dL (ref 12.0–15.0)
MCHC: 32.7 g/dL (ref 30.0–36.0)
MCV: 94.7 fl (ref 78.0–100.0)
Platelets: 257 10*3/uL (ref 150.0–400.0)
RBC: 4.08 Mil/uL (ref 3.87–5.11)
RDW: 14.2 % (ref 11.5–15.5)
WBC: 6.6 10*3/uL (ref 4.0–10.5)

## 2017-11-11 LAB — BASIC METABOLIC PANEL
BUN: 21 mg/dL (ref 6–23)
CALCIUM: 9.5 mg/dL (ref 8.4–10.5)
CO2: 30 mEq/L (ref 19–32)
Chloride: 102 mEq/L (ref 96–112)
Creatinine, Ser: 0.81 mg/dL (ref 0.40–1.20)
GFR: 76.09 mL/min (ref >60.00)
Glucose, Bld: 118 mg/dL — ABNORMAL HIGH (ref 70–99)
Potassium: 4.2 mEq/L (ref 3.5–5.1)
SODIUM: 140 meq/L (ref 135–145)

## 2017-11-11 LAB — LIPID PANEL
CHOL/HDL RATIO: 4
Cholesterol: 165 mg/dL (ref 0–200)
HDL: 44.7 mg/dL (ref >39.00)
LDL Cholesterol: 90 mg/dL (ref 0–99)
NONHDL: 120.15
Triglycerides: 150 mg/dL — ABNORMAL HIGH (ref 0.0–149.0)
VLDL: 30 mg/dL (ref 0.0–40.0)

## 2017-11-11 LAB — TSH: TSH: 0.86 u[IU]/mL (ref 0.35–4.50)

## 2017-11-11 LAB — HEMOGLOBIN A1C: Hgb A1c MFr Bld: 6.4 % (ref 4.6–6.5)

## 2017-11-12 ENCOUNTER — Other Ambulatory Visit: Payer: Self-pay | Admitting: Family Medicine

## 2017-11-15 MED ORDER — HYDROCORTISONE 2.5 % RE CREA: 1.0000 "application " | TOPICAL_CREAM | Freq: Two times a day (BID) | RECTAL | 0 refills | Status: DC

## 2017-11-25 ENCOUNTER — Other Ambulatory Visit: Payer: Self-pay | Admitting: Family Medicine

## 2017-12-09 ENCOUNTER — Ambulatory Visit (INDEPENDENT_AMBULATORY_CARE_PROVIDER_SITE_OTHER): Payer: BLUE CROSS/BLUE SHIELD | Admitting: Sports Medicine

## 2017-12-09 ENCOUNTER — Encounter: Payer: Self-pay | Admitting: Sports Medicine

## 2017-12-09 VITALS — BP 130/82 | HR 71 | Ht 66.0 in | Wt 258.4 lb

## 2017-12-09 DIAGNOSIS — M5416 Radiculopathy, lumbar region: Secondary | ICD-10-CM

## 2017-12-09 DIAGNOSIS — M6281 Muscle weakness (generalized): Secondary | ICD-10-CM

## 2017-12-09 DIAGNOSIS — M5441 Lumbago with sciatica, right side: Secondary | ICD-10-CM | POA: Diagnosis not present

## 2017-12-09 MED ORDER — GABAPENTIN 100 MG PO CAPS: 100.0000 mg | ORAL_CAPSULE | Freq: Three times a day (TID) | ORAL | 2 refills | Status: DC

## 2017-12-09 NOTE — Progress Notes (Signed)
Hannah Parsons. Hannah Parsons, [place] at [facility] - 62 y.o. female MRN [id number]  Date of birth: [birth year]  Visit Date: 12/09/2017  PCP: [name], DO   Referred by: [name], DO   Scribe for today's visit: Wendy Poet, LAT, ATC     SUBJECTIVE:  Hannah Parsons is here for Follow-up (LBP w/ R sciatica ) .   Notes from last visit on 09/27/17: Hannah Parsons is an established Hannah Parsons presenting today for f/u of Hannah Parsons LBP and R LE sciatica.  Hannah Parsons was last seen on 07/15/17 to discuss Hannah Parsons lumbar MRI results.  Hannah Parsons states that Hannah Parsons went to Hannah Parsons x 3 weeks and that went well.  Hannah Parsons states that Hannah Parsons is going to start some yoga.  Hannah Parsons notes that Hannah Parsons is now able to move w/o pain and w/o handicap but notes that Hannah Parsons does have some odd discomfort in Hannah Parsons R knee.  Hannah Parsons states that Hannah Parsons R LE does not feel or work the same as the L.  Hannah Parsons notes that Hannah Parsons has pain about 50% of the time in Hannah Parsons R knee and has decreased R knee ROM.  Hannah Parsons states that Hannah Parsons wore Hannah Parsons R knee brace up until about a week ago when Hannah Parsons tightened it extra tight because Hannah Parsons was outside at a skeet shooting event and feels like this bothered Hannah Parsons knee.  Hannah Parsons states that Hannah Parsons feels like Hannah Parsons's ok w/ walking but that Hannah Parsons will have some intermittent buckling of Hannah Parsons R knee.  Hannah Parsons has some intermittent, transient back pain and notes that the location varies from R, L and midline.  Hannah Parsons also notes that Hannah Parsons was having some memory difficulty and feels like this may be related to the gabepentin.  Hannah Parsons states that Hannah Parsons decreased Hannah Parsons gabepentin dose from 3x/day to 2x/day.  Hannah Parsons states that Hannah Parsons main concern is determining the root-cause of Hannah Parsons R knee pain to ensure that Hannah Parsons doesn't do anything to make it worse.   Compared to the last office visit on 09/27/17, Hannah Parsons previously described low back pain and radicular R LE symptoms are improving, still having back pain but not as intense.  Current symptoms are mild & are nonradiating  (pain is not longer radiating into RT leg).  In the past Hannah Parsons has tried Hannah Parsons, Depo Medrol and Ketorolac injections and epidural inj. Hannah Parsons has been prescribed Oxycodone and Medrol dosepak in the past (no longer taking). Hannah Parsons has NCV w/ EMG with Dr. Posey Pronto 09/21/2017. Hannah Parsons is currently taking Gabapentin 300 mg qPM.      ROS Denies night time disturbances. Denies fevers, chills, or night sweats. Denies unexplained weight loss. Denies personal history of cancer. Denies changes in bowel or bladder habits. Denies recent unreported falls. Denies new or worsening dyspnea or wheezing. Denies headaches or dizziness.  Denies numbness, tingling or weakness  In the extremities.  Denies dizziness or presyncopal episodes Denies lower extremity edema     HISTORY & PERTINENT PRIOR DATA:  Prior History reviewed and updated per electronic medical record.  Significant/pertinent history, findings, studies include:  reports that Hannah Parsons has never smoked. Hannah Parsons has never used smokeless tobacco. Recent Labs    06/15/17 1049 11/11/17 0853  HGBA1C 6.1 6.4   No specialty comments available. No problems updated.  OBJECTIVE:  VS:  HT:5\' 6"  (167.6 cm)   WT:258 lb 6.4 oz (117.2 kg)  BMI:41.73    BP:130/82  HR:71bpm  TEMP: ( )  RESP:99 %   PHYSICAL EXAM: Constitutional: WDWN, Non-toxic appearing. Psychiatric: Alert & appropriately interactive.  Not depressed or anxious appearing. Respiratory: No increased work of breathing.  Trachea Midline Eyes: Pupils are equal.  EOM intact without nystagmus.  No scleral icterus  Vascular Exam: warm to touch trace pedal edema  upper and lower extremity neuro exam: normal sensation Slight weakness in hip extension  MSK Exam: Negative SLR .    ASSESSMENT & PLAN:   1. Radiculopathy of lumbar region   2. Acute right-sided low back pain with right-sided sciatica   3. Muscle weakness (generalized)     PLAN:>50% of this 25 minute visit spent in direct patient  counseling and/or coordination of care.  Discussion was focused on education regarding the in discussing the pathoetiology and anticipated clinical course of the above condition.  Overall Hannah Parsons is doing better.  Nerve conduction studies are reassuring and Hannah Parsons will plan to return to work without restrictions at this time.  Continue with gabapentin.  Follow-up: Return in about 3 months (around 03/08/2018).       Please see additional documentation for Objective, Assessment and Plan sections. Pertinent additional documentation may be included in corresponding procedure notes, imaging studies, problem based documentation and patient instructions. Please see these sections of the encounter for additional information regarding this visit.  CMA/ATC served as Education administrator during this visit. History, Physical, and Plan performed by medical provider. Documentation and orders reviewed and attested to.      Gerda Diss, [place] [facility] Physician
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
## 2017-12-15 ENCOUNTER — Encounter: Payer: Self-pay | Admitting: Cardiovascular Disease

## 2017-12-15 ENCOUNTER — Ambulatory Visit (INDEPENDENT_AMBULATORY_CARE_PROVIDER_SITE_OTHER): Payer: BLUE CROSS/BLUE SHIELD | Admitting: Cardiovascular Disease

## 2017-12-15 VITALS — BP 124/80 | HR 73 | Ht 66.0 in | Wt 262.0 lb

## 2017-12-15 DIAGNOSIS — I10 Essential (primary) hypertension: Secondary | ICD-10-CM

## 2017-12-15 MED ORDER — HYDROCHLOROTHIAZIDE 25 MG PO TABS: 25.0000 mg | ORAL_TABLET | Freq: Every day | ORAL | 3 refills | Status: DC

## 2017-12-15 NOTE — Patient Instructions (Signed)

## 2018-01-10 ENCOUNTER — Other Ambulatory Visit: Payer: Self-pay | Admitting: Cardiovascular Disease

## 2018-01-25 ENCOUNTER — Ambulatory Visit: Payer: Self-pay

## 2018-01-25 ENCOUNTER — Ambulatory Visit (INDEPENDENT_AMBULATORY_CARE_PROVIDER_SITE_OTHER): Payer: BLUE CROSS/BLUE SHIELD | Admitting: Sports Medicine

## 2018-01-25 ENCOUNTER — Encounter: Payer: Self-pay | Admitting: Sports Medicine

## 2018-01-25 VITALS — BP 116/82 | HR 69 | Ht 66.0 in | Wt 254.6 lb

## 2018-01-25 DIAGNOSIS — M25532 Pain in left wrist: Secondary | ICD-10-CM

## 2018-01-25 DIAGNOSIS — M65842 Other synovitis and tenosynovitis, left hand: Secondary | ICD-10-CM | POA: Diagnosis not present

## 2018-01-25 DIAGNOSIS — M65832 Other synovitis and tenosynovitis, left forearm: Secondary | ICD-10-CM

## 2018-01-25 NOTE — Patient Instructions (Signed)

## 2018-01-25 NOTE — Procedures (Signed)
PROCEDURE NOTE:  Ultrasound Guided: Injection: Extensor Carpi Ulnaris tenosynovitis sheath injection Images were obtained and interpreted by myself, Teresa Coombs, DO  Images have been saved and stored to PACS system. Images obtained on: GE S7 Ultrasound machine  ULTRASOUND FINDINGS:  5th dorsal compartment tenosynotivis  DESCRIPTION OF PROCEDURE:  The patient's clinical condition is marked by substantial pain and/or significant functional disability. Other conservative therapy has not provided relief, is contraindicated, or not appropriate. There is a reasonable likelihood that injection will significantly improve the patient's pain and/or functional impairment.  After discussing the risks, benefits and expected outcomes of the injection and all questions were reviewed and answered, the patient wished to undergo the above named procedure. Verbal consent was obtained.  The ultrasound was used to identify the target structure and adjacent neurovascular structures. The skin was then prepped in sterile fashion and the target structure was injected under direct visualization using sterile technique as below:  PREP: Alcohol, Ethel Chloride APPROACH: direct, single injection, 25g 1.5in.  INJECTATE: 0.5cc: 1% lidocaine, 0.5cc: 0.5% marcaine, 0.5cc: 40mg /mL DepoMedrol   ASPIRATE: None   DRESSING: Band-Aid &: Body Helix Full wrist sleeve  Post procedural instructions including recommending icing and warning signs for infection were reviewed.   This procedure was well tolerated and there were no complications.   IMPRESSION: Succesful Ultrasound Guided: Injection

## 2018-01-25 NOTE — Progress Notes (Addendum)
Hannah Parsons. [name], Hannah Parsons at [facility] Hannah Parsons - 62 y.o. female MRN [id number]  Date of birth: January 25, 1956  Visit Date: 01/25/2018  PCP: [name], DO   Referred by: [name], DO  Scribe for today's visit: Josepha Pigg, CMA     SUBJECTIVE:  Hannah Parsons is here for Follow-up (L wrist pain)  06/18/17: As below and per problem based documentation when appropriate.  Hannah Parsons is a new patient referred by Dr. Colin Benton. She was referred for eval os possible bone cyst. 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 She has pain in the left wrist which she cannot describe, she says "it just hurts". Pain ranges from 3/10-6/10. The pain is constant. The wrist is tender to palpation. Any type of use of the wrist makes the pain worse. She wears a wrist brace while working which helps a little. She takes Ibuprofen for the pain and gets some relief. The pain does wake her from sleep. She occasionally has radiation of pain into the medial aspect of the hand and forearm.    01/25/18: Compared to the last office visit, her previously described symptoms are worsening, radiating into the arm. Pain with rotation, affected ability to do daily tasks. She does feel like the L arm is weaker from not being used. She denies decreased grip strength except when it relates to the pain.  Current symptoms are moderate & are radiating to the L arm.  She has been wearing wrist brace during working hours with minimal relief.   ROS Denies night time disturbances. Denies fevers, chills, or night sweats. Denies unexplained weight loss. Denies personal history of cancer. Denies changes in bowel or bladder habits. Denies recent unreported falls. Denies new or worsening dyspnea or wheezing. Denies headaches or dizziness.  Reports numbness, tingling or weakness  In the extremities.  Denies dizziness or presyncopal episodes Denies  lower extremity edema    HISTORY & PERTINENT PRIOR DATA:  Prior History reviewed and updated per electronic medical record.  Significant/pertinent history, findings, studies include:  reports that she has never smoked. She has never used smokeless tobacco. Recent Labs    06/15/17 1049 11/11/17 0853  HGBA1C 6.1 6.4   No specialty comments available. Problem  Extensor Tenosynovitis of Left Wrist    OBJECTIVE:  VS:  HT:5\' 6"  (167.6 cm)   WT:254 lb 9.6 oz (115.5 kg)  BMI:41.11    BP:116/82  HR:69bpm  TEMP: ( )  RESP:98 %   PHYSICAL EXAM: Constitutional: WDWN, Non-toxic appearing. Psychiatric: Alert & appropriately interactive.  Not depressed or anxious appearing. Respiratory: No increased work of breathing.  Trachea Midline Eyes: Pupils are equal.  EOM intact without nystagmus.  No scleral icterus  VASCULAR: no edema Radial Pulses: normal upper extremity neuro exam: unremarkable normal strength normal sensation  Left wrist pain directly over the ulnar aspect.  Pain is worse with ulnar deviation.  Small amount of pain with axial load and circumduction with minimal.  Grip strength is intact.  Pain with wrist extension   ASSESSMENT & PLAN:   1. Left wrist pain   2. Extensor tenosynovitis of left wrist     PLAN: Ultrasound-guided injection of the fifth dorsal compartment  Full wrist Body Helix Compression Sleeve       Follow-up: No follow-ups on file. Keep already scheduled appointment on 03/15/2018     Please see additional documentation for  Objective, Assessment and Plan sections. Pertinent additional documentation may be included in corresponding procedure notes, imaging studies, problem based documentation and patient instructions. Please see these sections of the encounter for additional information regarding this visit.  CMA/ATC served as Education administrator during this visit. History, Physical, and Plan performed by medical provider. Documentation and orders reviewed and  attested to.      Gerda Diss, [place] [facility] Physician

## 2018-01-29 ENCOUNTER — Encounter: Payer: Self-pay | Admitting: Sports Medicine

## 2018-02-08 ENCOUNTER — Other Ambulatory Visit: Payer: Self-pay | Admitting: Family Medicine

## 2018-02-08 DIAGNOSIS — Z1231 Encounter for screening mammogram for malignant neoplasm of breast: Secondary | ICD-10-CM

## 2018-02-14 ENCOUNTER — Other Ambulatory Visit: Payer: Self-pay | Admitting: Cardiovascular Disease

## 2018-02-14 ENCOUNTER — Encounter: Payer: Self-pay | Admitting: Family Medicine

## 2018-02-20 ENCOUNTER — Other Ambulatory Visit: Payer: Self-pay | Admitting: Family Medicine

## 2018-02-24 ENCOUNTER — Other Ambulatory Visit: Payer: Self-pay

## 2018-02-24 MED ORDER — GABAPENTIN 100 MG PO CAPS: 100.0000 mg | ORAL_CAPSULE | Freq: Three times a day (TID) | ORAL | 1 refills | Status: DC

## 2018-03-01 ENCOUNTER — Encounter: Payer: Self-pay | Admitting: Family Medicine

## 2018-03-01 ENCOUNTER — Other Ambulatory Visit: Payer: Self-pay | Admitting: Sports Medicine

## 2018-03-01 ENCOUNTER — Ambulatory Visit: Payer: BLUE CROSS/BLUE SHIELD | Admitting: Family Medicine

## 2018-03-01 VITALS — BP 110/74 | HR 69 | Temp 98.4°F | Ht 66.0 in | Wt 253.2 lb

## 2018-03-01 DIAGNOSIS — R0982 Postnasal drip: Secondary | ICD-10-CM | POA: Diagnosis not present

## 2018-03-01 DIAGNOSIS — J029 Acute pharyngitis, unspecified: Secondary | ICD-10-CM | POA: Diagnosis not present

## 2018-03-01 DIAGNOSIS — R21 Rash and other nonspecific skin eruption: Secondary | ICD-10-CM

## 2018-03-01 DIAGNOSIS — Z7189 Other specified counseling: Secondary | ICD-10-CM

## 2018-03-01 DIAGNOSIS — Z7185 Encounter for immunization safety counseling: Secondary | ICD-10-CM

## 2018-03-01 LAB — POCT RAPID STREP A (OFFICE): RAPID STREP A SCREEN: NEGATIVE
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
## 2018-03-02 ENCOUNTER — Ambulatory Visit
Admission: RE | Admit: 2018-03-02 | Discharge: 2018-03-02 | Disposition: A | Discharge status: Home / Self Care | Payer: BLUE CROSS/BLUE SHIELD | Source: Ambulatory Visit | Attending: Family Medicine | Admitting: Family Medicine

## 2018-03-02 DIAGNOSIS — Z1231 Encounter for screening mammogram for malignant neoplasm of breast: Secondary | ICD-10-CM

## 2018-03-15 ENCOUNTER — Ambulatory Visit: Payer: BLUE CROSS/BLUE SHIELD | Admitting: Sports Medicine

## 2018-03-15 ENCOUNTER — Other Ambulatory Visit: Payer: Self-pay | Admitting: Family Medicine

## 2018-03-18 ENCOUNTER — Encounter: Payer: Self-pay | Admitting: Sports Medicine

## 2018-03-18 ENCOUNTER — Ambulatory Visit (INDEPENDENT_AMBULATORY_CARE_PROVIDER_SITE_OTHER): Payer: BLUE CROSS/BLUE SHIELD | Admitting: Sports Medicine

## 2018-03-18 VITALS — BP 132/84 | HR 78 | Ht 66.0 in | Wt 259.2 lb

## 2018-03-18 DIAGNOSIS — M5416 Radiculopathy, lumbar region: Secondary | ICD-10-CM | POA: Diagnosis not present

## 2018-03-18 DIAGNOSIS — M65832 Other synovitis and tenosynovitis, left forearm: Secondary | ICD-10-CM

## 2018-03-18 DIAGNOSIS — R21 Rash and other nonspecific skin eruption: Secondary | ICD-10-CM | POA: Diagnosis not present

## 2018-03-18 DIAGNOSIS — M25532 Pain in left wrist: Secondary | ICD-10-CM | POA: Diagnosis not present

## 2018-03-18 DIAGNOSIS — M5441 Lumbago with sciatica, right side: Secondary | ICD-10-CM

## 2018-03-18 NOTE — Progress Notes (Signed)
Hannah Parsons. Hannah Parsons, [place] at [facility] - 62 y.o. female MRN [id number]  Date of birth: December 29, 1955  Visit Date: 03/18/2018  PCP: [name], DO   Referred by: [name], DO  Scribe for today's visit: Josepha Pigg, CMA     SUBJECTIVE:  Hannah Parsons is here for Follow-up (back pain, L wrist pain)  06/18/17: As below and per problem based documentation when appropriate.  Hannah Parsons is a new patient referred by Dr. Colin Benton. She was referred for eval os possible bone cyst. 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 She has pain in the left wrist which she cannot describe, she says "it just hurts". Pain ranges from 3/10-6/10. The pain is constant. The wrist is tender to palpation. Any type of use of the wrist makes the pain worse. She wears a wrist brace while working which helps a little. She takes Ibuprofen for the pain and gets some relief. The pain does wake her from sleep. She occasionally has radiation of pain into the medial aspect of the hand and forearm.    01/25/18: Compared to the last office visit, her previously described symptoms are worsening, radiating into the arm. Pain with rotation, affected ability to do daily tasks. She does feel like the L arm is weaker from not being used. She denies decreased grip strength except when it relates to the pain.  Current symptoms are moderate & are radiating to the L arm.  She has been wearing wrist brace during working hours with minimal relief.   03/18/2018: L wrist Compared to the last office visit, her previously described symptoms are improving. Current symptoms are mild & are nonradiating She received steroid injection 01/25/18 and responded well, currently having no pain.   Back pain Compared to the last office visit, her previously described symptoms are improving, she is still having stiffness in the morning. Pain is now  occasional.  Current symptoms are mild & are nonradiating She has been taking Gabapentin 100 qAM and 200 qPM.  MRI 07/14/17, NCB 09/21/17  ROS Denies night time disturbances. Denies fevers, chills, or night sweats. Denies unexplained weight loss. Denies personal history of cancer. Denies changes in bowel or bladder habits. Denies recent unreported falls. Denies new or worsening dyspnea or wheezing. Denies headaches or dizziness.  Denies numbness, tingling or weakness  In the extremities.  Denies dizziness or presyncopal episodes Denies lower extremity edema    HISTORY & PERTINENT PRIOR DATA:  Prior History reviewed and updated per electronic medical record.  Significant/pertinent history, findings, studies include:  reports that she has never smoked. She has never used smokeless tobacco. Recent Labs    06/15/17 1049 11/11/17 0853  HGBA1C 6.1 6.4   No specialty comments available. No problems updated.  OBJECTIVE:  VS:  HT:5\' 6"  (167.6 cm)   WT:259 lb 3.2 oz (117.6 kg)  BMI:41.86    BP:132/84  HR:78bpm  TEMP: ( )  RESP:98 %   PHYSICAL EXAM: Constitutional: WDWN, Non-toxic appearing. Psychiatric: Alert & appropriately interactive.  Not depressed or anxious appearing. Respiratory: No increased work of breathing.  Trachea Midline Eyes: Pupils are equal.  EOM intact without nystagmus.  No scleral icterus  Vascular Exam: warm to touch no edema  upper and lower extremity neuro exam: Right lower extremities overall improved strength with ability to perform a straight leg raise but still with significant weakness compared to  the left at 4 out of 5.  She is able to heel toe walk without difficulty.  MSK Exam: Left wrist is overall improved.  She has only mild pain with Wynn Maudlin testing more focal pain with resisted wrist extension.  She does have a generalized rash on her arms and legs.  Has an ascending type characteristic.  This does appear to be drug related  possibly with small petechial components.   ASSESSMENT & PLAN:   1. Rash   2. Left wrist pain   3. Extensor tenosynovitis of left wrist   4. Radiculopathy of lumbar region   5. Acute right-sided low back pain with right-sided sciatica     PLAN: Continue with therapeutic exercises.  I am suspicious that the underlying rash may be drug related and possibly related to her diltiazem.  Will defer to further experts regarding this and may consider biopsy.  If any worsening symptoms or worsening neurologic features could consider repeat   Follow-up: Return if symptoms worsen or fail to improve, for as needed for ongoing issues.      Please see additional documentation for Objective, Assessment and Plan sections. Pertinent additional documentation may be included in corresponding procedure notes, imaging studies, problem based documentation and patient instructions. Please see these sections of the encounter for additional information regarding this visit.  CMA/ATC served as Education administrator during this visit. History, Physical, and Plan performed by medical provider. Documentation and orders reviewed and attested to.      Gerda Diss, [facility] Physician

## 2018-03-19 ENCOUNTER — Other Ambulatory Visit: Payer: Self-pay | Admitting: Family Medicine

## 2018-03-31 ENCOUNTER — Other Ambulatory Visit: Payer: Self-pay

## 2018-03-31 ENCOUNTER — Ambulatory Visit: Payer: BLUE CROSS/BLUE SHIELD | Admitting: Obstetrics and Gynecology

## 2018-03-31 ENCOUNTER — Ambulatory Visit (INDEPENDENT_AMBULATORY_CARE_PROVIDER_SITE_OTHER): Payer: BLUE CROSS/BLUE SHIELD | Admitting: Obstetrics and Gynecology

## 2018-03-31 ENCOUNTER — Encounter: Payer: Self-pay | Admitting: Obstetrics and Gynecology

## 2018-03-31 VITALS — BP 136/70 | HR 80 | Resp 16 | Ht 65.5 in | Wt 259.0 lb

## 2018-03-31 DIAGNOSIS — Z01419 Encounter for gynecological examination (general) (routine) without abnormal findings: Secondary | ICD-10-CM

## 2018-03-31 NOTE — Patient Instructions (Signed)
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
## 2018-04-20 LAB — HM DIABETES EYE EXAM

## 2018-05-10 ENCOUNTER — Encounter: Payer: Self-pay | Admitting: Family Medicine

## 2018-06-09 ENCOUNTER — Encounter: Payer: Self-pay | Admitting: Gastroenterology

## 2018-08-09 ENCOUNTER — Other Ambulatory Visit: Payer: Self-pay | Admitting: Family Medicine

## 2018-08-25 ENCOUNTER — Ambulatory Visit (INDEPENDENT_AMBULATORY_CARE_PROVIDER_SITE_OTHER): Payer: 59 | Admitting: Family Medicine

## 2018-08-25 ENCOUNTER — Encounter: Payer: Self-pay | Admitting: Family Medicine

## 2018-08-25 VITALS — BP 116/80 | HR 68 | Temp 98.4°F | Ht 65.5 in | Wt 247.5 lb

## 2018-08-25 DIAGNOSIS — I152 Hypertension secondary to endocrine disorders: Secondary | ICD-10-CM

## 2018-08-25 DIAGNOSIS — I10 Essential (primary) hypertension: Secondary | ICD-10-CM

## 2018-08-25 DIAGNOSIS — E038 Other specified hypothyroidism: Secondary | ICD-10-CM | POA: Diagnosis not present

## 2018-08-25 DIAGNOSIS — E119 Type 2 diabetes mellitus without complications: Secondary | ICD-10-CM | POA: Diagnosis not present

## 2018-08-25 DIAGNOSIS — E1159 Type 2 diabetes mellitus with other circulatory complications: Secondary | ICD-10-CM | POA: Diagnosis not present

## 2018-08-25 LAB — CBC
HEMATOCRIT: 39.9 % (ref 36.0–46.0)
HEMOGLOBIN: 13.3 g/dL (ref 12.0–15.0)
MCHC: 33.3 g/dL (ref 30.0–36.0)
MCV: 90.1 fl (ref 78.0–100.0)
Platelets: 252 10*3/uL (ref 150.0–400.0)
RBC: 4.43 Mil/uL (ref 3.87–5.11)
RDW: 14.3 % (ref 11.5–15.5)
WBC: 7.7 10*3/uL (ref 4.0–10.5)

## 2018-08-25 LAB — HEMOGLOBIN A1C: Hgb A1c MFr Bld: 6.3 % (ref 4.6–6.5)

## 2018-08-25 LAB — TSH: TSH: 0.26 u[IU]/mL — ABNORMAL LOW (ref 0.35–4.50)

## 2018-08-25 LAB — BASIC METABOLIC PANEL
BUN: 21 mg/dL (ref 6–23)
CO2: 27 mEq/L (ref 19–32)
CREATININE: 0.88 mg/dL (ref 0.40–1.20)
Calcium: 10 mg/dL (ref 8.4–10.5)
Chloride: 99 mEq/L (ref 96–112)
GFR: 68.97 mL/min (ref >60.00)
GLUCOSE: 120 mg/dL — AB (ref 70–99)
POTASSIUM: 4.3 meq/L (ref 3.5–5.1)
Sodium: 134 mEq/L — ABNORMAL LOW (ref 135–145)
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
## 2018-08-25 NOTE — Patient Instructions (Addendum)
BEFORE YOU LEAVE: -labs -follow up: 4-6 months for CPE  Please call to schedule your colonosocpy asap.  We have ordered labs or studies at this visit. It can take up to 1-2 weeks for results and processing. IF results require follow up or explanation, we will call you with instructions. Clinically stable results will be released to your [facility]. If you have not heard from [place] or cannot find your results in [facility] in 2 weeks please contact our office at [phone].  If you are not yet signed up for [facility], please consider signing up.    We recommend the following healthy lifestyle for LIFE: 1) Small portions. But, make sure to get regular (at least 3 per day), healthy meals and small healthy snacks if needed.  2) Eat a healthy clean diet.   TRY TO EAT: -at least 5-7 servings of low sugar, colorful, and nutrient rich vegetables per day (not corn, potatoes or bananas.) -berries are the best choice if you wish to eat fruit (only eat small amounts if trying to reduce weight)  -lean meets (fish, white meat of chicken or [place]) -vegan proteins for some meals - beans or tofu, whole grains, nuts and seeds -Replace bad fats with good fats - good fats include: fish, nuts and seeds, canola oil, olive oil -small amounts of low fat or non fat dairy -small amounts of100 % whole grains - check the lables -drink plenty of water  AVOID: -SUGAR, sweets, anything with added sugar, corn syrup or sweeteners - must read labels as even foods advertised as "healthy" often are loaded with sugar -if you must have a sweetener, small amounts of stevia may be best -sweetened beverages and artificially sweetened beverages -simple starches (rice, bread, potatoes, pasta, chips, etc - small amounts of 100% whole grains are ok) -red meat, pork, butter -fried foods, fast food, processed food, excessive dairy, eggs and coconut.  3)Get at least 150 minutes of sweaty aerobic exercise per week.  4)Reduce stress -  consider counseling, meditation and relaxation to balance other aspects of your life.

## 2018-08-26 ENCOUNTER — Encounter: Payer: Self-pay | Admitting: Family Medicine

## 2018-08-29 MED ORDER — LEVOTHYROXINE SODIUM 175 MCG PO TABS: 175.0000 ug | ORAL_TABLET | Freq: Every day | ORAL | 3 refills | Status: DC

## 2018-08-29 NOTE — Addendum Note (Signed)
Addended by: Agnes Lawrence on: 08/29/2018 05:04 PM   Modules accepted: Orders

## 2018-09-15 ENCOUNTER — Ambulatory Visit: Payer: 59 | Admitting: Sports Medicine

## 2018-09-16 ENCOUNTER — Encounter: Payer: Self-pay | Admitting: Sports Medicine

## 2018-09-16 ENCOUNTER — Ambulatory Visit: Payer: Self-pay

## 2018-09-16 ENCOUNTER — Ambulatory Visit (INDEPENDENT_AMBULATORY_CARE_PROVIDER_SITE_OTHER): Payer: 59 | Admitting: Sports Medicine

## 2018-09-16 VITALS — BP 128/80 | HR 77 | Ht 65.5 in | Wt 251.6 lb

## 2018-09-16 DIAGNOSIS — M25572 Pain in left ankle and joints of left foot: Secondary | ICD-10-CM

## 2018-09-16 DIAGNOSIS — M19079 Primary osteoarthritis, unspecified ankle and foot: Secondary | ICD-10-CM | POA: Diagnosis not present

## 2018-09-16 DIAGNOSIS — M25571 Pain in right ankle and joints of right foot: Secondary | ICD-10-CM | POA: Diagnosis not present

## 2018-09-16 NOTE — Progress Notes (Signed)
Hannah Parsons. , Hannah Parsons at [facility] [place] - 62 y.o. female MRN [id number]  Date of birth: [birth year]  Visit Date: 09/16/2018  PCP: [name], DO   Referred by: [name], DO   Scribe(s) for today's visit: Wendy Poet, LAT, ATC  SUBJECTIVE:  Hannah Parsons is here for Initial Assessment (B foot pain)  HPI: Her B foot (R lateral foot and L heel) symptoms INITIALLY: Began years ago (15 years ago) w/ a cyst removal in her R lateral foot and is now having increased pain that feels like the "bones are sticking/rubbing together."  The area of discomfort is spreading and getting bigger.  About a month ago she was stepping into a car and got a stabbing pain along her L post ankle.  She states that she's had a few episodes of these L ankle symptoms since and reports an overstretching/tearing sensation when she actively DFs her L ankle. Described as moderate burning pain on the R lateral foot and moderate stretching/tearing pain on the L post ankle / L Achille's, nonradiating Worsened with pressure on the R foot and L ankle DF and weight bearing for the L Achille's  Improved with nothing noted Additional associated symptoms include: some N/T in the R lateral foot but none on the L; L ant-lat ankle swelling    At this time symptoms are worsening compared to onset  She is not taking any medications for either of these areas.  REVIEW OF SYSTEMS: Reports night time disturbances. Denies fevers, chills, or night sweats. Denies unexplained weight loss. Denies personal history of cancer. Denies changes in bowel or bladder habits. Denies recent unreported falls. Denies new or worsening dyspnea or wheezing. Denies headaches or dizziness.  Reports numbness, tingling or weakness  In the extremities - in the L foot Denies dizziness or presyncopal episodes Denies lower extremity edema    HISTORY:  Prior history reviewed  and updated per electronic medical record.  Social History   Occupational History  . Occupation: ORTHO [employer]    Employer: [employer]  Tobacco Use  . Smoking status: Never Smoker  . Smokeless tobacco: Never Used  Substance and Sexual Activity  . Alcohol use: Yes    Comment: Rarely  . Drug use: No  . Sexual activity: Yes    Partners: Male    Birth control/protection: Surgical    Comment: hysterectomy   Social History   Social History Narrative   Work or School: Dispensing optician for [employer] Situation: lives with husband - working on renovating a house in [place]: a little exercise - walking 20 minutes a few days per week; diet not great       DATA OBTAINED & REVIEWED:   Recent Labs    11/11/17 0853 08/25/18 1101  HGBA1C 6.4 6.3   No problems updated. . 09/16/2018: MSK ultrasound shows midfoot degenerative bossing  OBJECTIVE:  VS:  HT:5' 5.5" (166.4 cm)   WT:251 lb 9.6 oz (114.1 kg)  BMI:41.22    BP:128/80  HR:77bpm  TEMP: ( )  RESP:100 %   PHYSICAL EXAM: CONSTITUTIONAL: Well-developed, Well-nourished and In no acute distress PSYCHIATRIC: Alert & appropriately interactive. and Not depressed or anxious appearing. RESPIRATORY: No increased work of breathing and Trachea Midline EYES: Pupils are equal., EOM intact  without nystagmus. and No scleral icterus.  VASCULAR EXAM: Warm and well perfused NEURO: unremarkable Normal associated myotomal distribution strength to manual muscle testing Normal sensation to light touch  MSK Exam: Bilateral foot  Well aligned, no significant deformity. No overlying skin changes. Mild TTP over the midfoot most focally on the right greater left.  She has swelling along the lateral at the midfoot directly over the tarsometatarsal junction.  No pain with forefoot abduction no pain directly over the Lisfranc joint.   RANGE OF  MOTION & STRENGTH  Normal, non-painful Dorsiflexion plantarflexion.  She has moderately high cavus foot.   SPECIALITY TESTING:  Ankle drawer testing is stable.  She has no significant pain with talar tilt.  No pain metatarsal squeeze test.     ASSESSMENT   1. Arthralgia of both ankles   2. Arthritis of midfoot     PLAN:  Pertinent additional documentation may be included in corresponding procedure notes, imaging studies, problem based documentation and patient instructions.  Procedures:  . None  Medications:  No orders of the defined types were placed in this encounter.  Discussion/Instructions: No problem-specific Assessment & Plan notes found for this encounter.  Marland Kitchen Ultimately she does have moderate degree of medical arthritis.  She is also discussed with her options. . Prescription for topical Pennsaid provided to be tried as well as cool water soaking.  Compression sleeves discussed. . Body Helix Compression Sleeve provided today per AVS . Discussed red flag symptoms that warrant earlier emergent evaluation and patient voices understanding. . Activity modifications and the importance of avoiding exacerbating activities (limiting pain to no more than a 4 / 10 during or following activity) recommended and discussed. . >50% of this 25 minutes minute visit spent in direct patient counseling and/or coordination of care. Discussion was focused on education regarding the in discussing the pathoetiology and anticipated clinical course of the above condition.  Follow-up:  . Return in about 6 weeks (around 10/28/2018).  . If any lack of improvement: consider further diagnostic evaluation with Plain film x-rays and/or MRI. Marland Kitchen At follow up will plan: To consider custom cushion insoles if any lack of improvement.     CMA/ATC served as Education administrator during this visit. History, Physical, and Plan performed by medical provider. Documentation and orders reviewed and attested to.      Gerda Diss, [place] [facility] Physician

## 2018-09-16 NOTE — Patient Instructions (Signed)
Pennsaid instructions: You have been given a sample/prescription for Pennsaid, a topical medication.     You are to apply this gel to your injured body part twice daily (morning and evening).   A little goes a long way so you can use about a pea-sized amount for each area.   Spread this small amount over the area into a thin film and let it dry.   Be sure that you do not rub the gel into your skin for more than 10 or 15 seconds otherwise it can irritate you skin.    Once you apply the gel, please do not put any other lotion or clothing in contact with that area for 30 minutes to allow the gel to absorb into your skin.   Some people are sensitive to the medication and can develop a sunburn-like rash.  If you have only mild symptoms it is okay to continue to use the medication but if you have any breakdown of your skin you should discontinue its use and please let us know.   If you have been written a prescription for Pennsaid, you will receive a pump bottle of this topical gel through a mail order pharmacy.  The instructions on the bottle will say to apply two pumps twice a day which may be too much gel for your particular area so use the pea-sized amount as your guide.   Instructions for Duexis, Pennsaid and Vimovo:  Your prescription will be filled through a participating [facility] mail order pharmacy.  You will receive a phone call or text from one of the participating pharmacies which can be located in any state in the [place].  You must communicate directly with them to have this medication filled.  When the pharmacy contacts you, they will need your mailing address (for shipment of the medication) andy they will need payment information if you have a copay (typically no more than $10). If you have not heard from them 2-3 days after your appointment with Dr. Paulla Fore, contact [facility] directly at [phone].   I recommend you obtained a compression sleeve to help with  your joint problems. There are many options on the market however I recommend obtaining a ankle Body Helix compression sleeve.  You can find information (including how to appropriate measure yourself for sizing) can be found at www.Body http://www.lambert.com/.  Many of these products are health savings account (HSA) eligible.   You can use the compression sleeve at any time throughout the day but is most important to use while being active as well as for 2 hours post-activity.   It is appropriate to ice following activity with the compression sleeve in place.

## 2018-09-17 ENCOUNTER — Encounter: Payer: Self-pay | Admitting: Sports Medicine

## 2018-09-17 NOTE — Procedures (Signed)
LIMITED MSK ULTRASOUND OF Bilateral foot Images were obtained and interpreted by myself, Teresa Coombs, DO  Images have been saved and stored to PACS system. Images obtained on: GE S7 Ultrasound machine  FINDINGS:   Right foot with generalized mid osteoarthritic bossing and slight synovitis appreciated.  Increased neovascularity at the tarsometatarsal junction.  No significant soft tissue irregularity or significant muscular disruption..  IMPRESSION:  1. 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

## 2018-09-30 ENCOUNTER — Telehealth: Payer: Self-pay | Admitting: Family Medicine

## 2018-09-30 NOTE — Telephone Encounter (Signed)
Copied from [place] [id number]. Topic: Quick Communication - Rx Refill/Question >> Sep 30, 2018 10:56 AM Scherrie Gerlach wrote: Medication: Duexis, Pennsaid and Vimovo: pt saw Dr Paulla Fore on 11/15 and prescribed this med. Pt following up on this request, as [facility] mail order pharmacy never received this Rx request.

## 2018-10-01 ENCOUNTER — Other Ambulatory Visit: Payer: Self-pay

## 2018-10-01 MED ORDER — DICLOFENAC SODIUM 2 % TD SOLN: 1.0000 "application " | Freq: Two times a day (BID) | TRANSDERMAL | 2 refills | Status: DC

## 2018-10-01 NOTE — Telephone Encounter (Signed)
Called pt and left VM to call the office. Added note to CRM.

## 2018-10-01 NOTE — Telephone Encounter (Signed)
Per OV note - Pennsaid instructions: You have been given a sample/prescription for Pennsaid, a topical medication.     Rx sent to [facility].

## 2018-10-04 NOTE — Telephone Encounter (Signed)
Sent the following to pt via MyChart -  Hi Hannah Parsons,  I called and spoke with Ebony Hail at [facility], she said that someone should be reaching out to you today regarding the prescription for Pennsaid. If for some reason you do not hear from them, or if you need to call them back, their number is [phone].   Let me know if you have any questions or concerns,   Overton Boggus/CMA

## 2018-10-04 NOTE — Telephone Encounter (Signed)
Spoke with pt, she advised that she has not been contact by the pharmacy. She also reports that she was on hold for 12 minutes when trying to return my call and would prefer to be contact via [place]. Will call pharmacy and confirm that they have rx and message pt via [place].

## 2018-10-04 NOTE — Telephone Encounter (Signed)
Called pt and left VM to call the office. Want to make sure she was contacted by pharmacy regarding Pennsaid Rx.

## 2018-10-10 ENCOUNTER — Other Ambulatory Visit (INDEPENDENT_AMBULATORY_CARE_PROVIDER_SITE_OTHER): Payer: 59

## 2018-10-10 DIAGNOSIS — E038 Other specified hypothyroidism: Secondary | ICD-10-CM | POA: Diagnosis not present

## 2018-10-10 LAB — TSH: TSH: 0.88 u[IU]/mL (ref 0.35–4.50)

## 2018-10-28 ENCOUNTER — Ambulatory Visit (AMBULATORY_SURGERY_CENTER): Payer: Self-pay | Admitting: *Deleted

## 2018-10-28 VITALS — Ht 66.5 in | Wt 258.0 lb

## 2018-10-28 DIAGNOSIS — Z86010 Personal history of colonic polyps: Secondary | ICD-10-CM

## 2018-10-28 MED ORDER — NA SULFATE-K SULFATE-MG SULF 17.5-3.13-1.6 GM/177ML PO SOLN
ORAL | 0 refills | Status: DC
Start: 2018-10-28 — End: 2019-05-09

## 2018-10-28 NOTE — Progress Notes (Signed)
Patient denies any allergies to eggs or soy. Patient denies any problems with anesthesia/sedation. Patient denies any oxygen use at home. Patient denies taking any diet/weight loss medications or blood thinners. EMMI education offered, pt declined. Suprep $ 15 coupon given to pt. Patient takes Miralax daily.

## 2018-10-31 ENCOUNTER — Other Ambulatory Visit: Payer: Self-pay | Admitting: Family Medicine

## 2018-10-31 ENCOUNTER — Encounter: Payer: Self-pay | Admitting: Internal Medicine

## 2018-10-31 ENCOUNTER — Encounter: Payer: 59 | Admitting: Internal Medicine

## 2018-11-05 ENCOUNTER — Other Ambulatory Visit: Payer: Self-pay | Admitting: Family Medicine

## 2018-11-10 ENCOUNTER — Ambulatory Visit: Payer: 59 | Admitting: Sports Medicine

## 2018-11-14 ENCOUNTER — Encounter: Payer: Self-pay | Admitting: Internal Medicine

## 2018-11-14 ENCOUNTER — Ambulatory Visit (AMBULATORY_SURGERY_CENTER): Payer: 59 | Admitting: Internal Medicine

## 2018-11-14 VITALS — BP 100/58 | HR 69 | Temp 96.6°F | Resp 13 | Ht 65.5 in | Wt 251.0 lb

## 2018-11-14 DIAGNOSIS — D124 Benign neoplasm of descending colon: Secondary | ICD-10-CM

## 2018-11-14 DIAGNOSIS — Z86010 Personal history of colonic polyps: Secondary | ICD-10-CM | POA: Diagnosis present

## 2018-11-14 DIAGNOSIS — D122 Benign neoplasm of ascending colon: Secondary | ICD-10-CM

## 2018-11-14 MED ORDER — SODIUM CHLORIDE 0.9 % IV SOLN: 500.0000 mL | Freq: Once | INTRAVENOUS | Status: AC

## 2018-11-14 NOTE — Progress Notes (Signed)
Pt's states no medical or surgical changes since previsit or office visit. 

## 2018-11-14 NOTE — Patient Instructions (Signed)
Impression/Recommendations:  Polyp handout given to patient. Diverticulosis handout given to patient.  Repeat colonoscopy in 5 years for surveillance.  Resume previous diet. Continue present medications.  YOU HAD AN ENDOSCOPIC PROCEDURE TODAY AT [facility]:   Refer to the procedure report that was given to you for any specific questions about what was found during the examination.  If the procedure report does not answer your questions, please call your gastroenterologist to clarify.  If you requested that your care partner not be given the details of your procedure findings, then the procedure report has been included in a sealed envelope for you to review at your convenience later.  YOU SHOULD EXPECT: Some feelings of bloating in the abdomen. Passage of more gas than usual.  Walking can help get rid of the air that was put into your GI tract during the procedure and reduce the bloating. If you had a lower endoscopy (such as a colonoscopy or flexible sigmoidoscopy) you may notice spotting of blood in your stool or on the toilet paper. If you underwent a bowel prep for your procedure, you may not have a normal bowel movement for a few days.  Please Note:  You might notice some irritation and congestion in your nose or some drainage.  This is from the oxygen used during your procedure.  There is no need for concern and it should clear up in a day or so.  SYMPTOMS TO REPORT IMMEDIATELY:   Following lower endoscopy (colonoscopy or flexible sigmoidoscopy):  Excessive amounts of blood in the stool  Significant tenderness or worsening of abdominal pains  Swelling of the abdomen that is new, acute  Fever of 100F or higher  For urgent or emergent issues, a gastroenterologist can be reached at any hour by calling [phone].   DIET:  We do recommend a small meal at first, but then you may proceed to your regular diet.  Drink plenty of fluids but you should avoid alcoholic  beverages for 24 hours.  ACTIVITY:  You should plan to take it easy for the rest of today and you should NOT DRIVE or use heavy machinery until tomorrow (because of the sedation medicines used during the test).    FOLLOW UP: Our staff will call the number listed on your records the next business day following your procedure to check on you and address any questions or concerns that you may have regarding the information given to you following your procedure. If we do not reach you, we will leave a message.  However, if you are feeling well and you are not experiencing any problems, there is no need to return our call.  We will assume that you have returned to your regular daily activities without incident.  If any biopsies were taken you will be contacted by phone or by letter within the next 1-3 weeks.  Please call us at [phone] if you have not heard about the biopsies in 3 weeks.    SIGNATURES/CONFIDENTIALITY: You and/or your care partner have signed paperwork which will be entered into your electronic medical record.  These signatures attest to the fact that that the information above on your After Visit Summary has been reviewed and is understood.  Full responsibility of the confidentiality of this discharge information lies with you and/or your care-partner.

## 2018-11-14 NOTE — Progress Notes (Signed)
Called to room to assist during endoscopic procedure.  Patient ID and intended procedure confirmed with present staff. Received instructions for my participation in the procedure from the performing physician.  

## 2018-11-14 NOTE — Op Note (Signed)
[facility] Patient Name: Hannah Parsons Procedure Date: 11/14/2018 10:59 AM MRN: [id number] Endoscopist: Docia Chuck. Henrene Pastor , MD Age: 63 Referring MD:  Date of Birth: 1956-05-26 Gender: Female Account #: [id number] Procedure:                Colonoscopy with cold snare polypectomy x 3 Indications:              High risk colon cancer surveillance: Personal                            history of multiple (3 or more) adenomas. Previous                            examinations 2010 and 2014 Medicines:                Monitored Anesthesia Care Procedure:                Pre-Anesthesia Assessment:                           - Prior to the procedure, a History and Physical                            was performed, and patient medications and                            allergies were reviewed. The patient's tolerance of                            previous anesthesia was also reviewed. The risks                            and benefits of the procedure and the sedation                            options and risks were discussed with the patient.                            All questions were answered, and informed consent                            was obtained. Prior Anticoagulants: The patient has                            taken no previous anticoagulant or antiplatelet                            agents. ASA Grade Assessment: II - A patient with                            mild systemic disease. After reviewing the risks                            and benefits, the patient was deemed in  satisfactory condition to undergo the procedure.                           After obtaining informed consent, the colonoscope                            was passed under direct vision. Throughout the                            procedure, the patient's blood pressure, pulse, and                            oxygen saturations were monitored continuously. The   Colonoscope was introduced through the anus and                            advanced to the the cecum, identified by                            appendiceal orifice and ileocecal valve. The                            ileocecal valve, appendiceal orifice, and rectum                            were photographed. The quality of the bowel                            preparation was excellent. The colonoscopy was                            performed without difficulty. The patient tolerated                            the procedure well. The bowel preparation used was                            SUPREP. Scope In: 11:09:34 AM Scope Out: 11:23:00 AM Total Procedure Duration: 0 hours 13 minutes 26 seconds  Findings:                 Three polyps were found in the descending colon and                            ascending colon. The polyps were 1 to 3 mm in size.                            These polyps were removed with a cold snare.                            Resection and retrieval were complete.                           Multiple diverticula were found in the left colon.  The exam was otherwise without abnormality on                            direct and retroflexion views. Complications:            No immediate complications. Estimated blood loss:                            None. Estimated Blood Loss:     Estimated blood loss: none. Impression:               - Three 1 to 3 mm polyps in the descending colon                            and in the ascending colon, removed with a cold                            snare. Resected and retrieved.                           - Diverticulosis in the left colon.                           - The examination was otherwise normal on direct                            and retroflexion views. Recommendation:           - Repeat colonoscopy in 5 years for surveillance.                           - Patient has a contact number available for                             emergencies. The signs and symptoms of potential                            delayed complications were discussed with the                            patient. Return to normal activities tomorrow.                            Written discharge instructions were provided to the                            patient.                           - Resume previous diet.                           - Continue present medications.                           - Await pathology results. Docia Chuck. Henrene Pastor, MD 11/14/2018 11:28:06 AM This report has  been signed electronically.

## 2018-11-14 NOTE — Progress Notes (Signed)
PT taken to PACU. Monitors in place. VSS. Report given to RN. 

## 2018-11-15 ENCOUNTER — Telehealth: Payer: Self-pay | Admitting: *Deleted

## 2018-11-15 NOTE — Telephone Encounter (Signed)
  Follow up Call-  Call back number 11/14/2018  Post procedure Call Back phone  # [phone]  Permission to leave phone message Yes  Some recent data might be hidden     Patient questions:  Do you have a fever, pain , or abdominal swelling? No. Pain Score  0 *  Have you tolerated food without any problems? Yes.    Have you been able to return to your normal activities? Yes.    Do you have any questions about your discharge instructions: Diet   No. Medications  No. Follow up visit  No.  Do you have questions or concerns about your Care? No.  Actions: * If pain score is 4 or above: No action needed, pain <4.

## 2018-11-15 NOTE — Telephone Encounter (Signed)
First follow up call attempt.  Reached voicemail with phone number identified.  Message left to call if any questions or concerns. 

## 2018-11-17 ENCOUNTER — Encounter: Payer: Self-pay | Admitting: Internal Medicine

## 2018-12-07 ENCOUNTER — Other Ambulatory Visit: Payer: Self-pay | Admitting: Physician Assistant

## 2018-12-15 ENCOUNTER — Encounter: Payer: Self-pay | Admitting: Cardiovascular Disease

## 2019-01-02 ENCOUNTER — Ambulatory Visit: Payer: 59 | Admitting: Cardiovascular Disease

## 2019-01-05 ENCOUNTER — Ambulatory Visit: Payer: 59 | Admitting: Cardiovascular Disease
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
## 2019-01-09 ENCOUNTER — Other Ambulatory Visit: Payer: Self-pay | Admitting: Cardiovascular Disease

## 2019-02-01 ENCOUNTER — Encounter: Payer: Self-pay | Admitting: Family Medicine

## 2019-02-01 ENCOUNTER — Encounter: Payer: Self-pay | Admitting: Sports Medicine

## 2019-02-03 ENCOUNTER — Other Ambulatory Visit: Payer: Self-pay | Admitting: Cardiovascular Disease

## 2019-02-05 ENCOUNTER — Other Ambulatory Visit: Payer: Self-pay | Admitting: Cardiovascular Disease

## 2019-02-09 ENCOUNTER — Ambulatory Visit (INDEPENDENT_AMBULATORY_CARE_PROVIDER_SITE_OTHER): Payer: 59 | Admitting: Family Medicine

## 2019-02-09 ENCOUNTER — Other Ambulatory Visit: Payer: Self-pay

## 2019-02-09 ENCOUNTER — Telehealth: Payer: Self-pay | Admitting: *Deleted

## 2019-02-09 ENCOUNTER — Encounter: Payer: Self-pay | Admitting: Family Medicine

## 2019-02-09 DIAGNOSIS — E119 Type 2 diabetes mellitus without complications: Secondary | ICD-10-CM

## 2019-02-09 DIAGNOSIS — M79675 Pain in left toe(s): Secondary | ICD-10-CM

## 2019-02-09 DIAGNOSIS — E785 Hyperlipidemia, unspecified: Secondary | ICD-10-CM

## 2019-02-09 DIAGNOSIS — E038 Other specified hypothyroidism: Secondary | ICD-10-CM | POA: Diagnosis not present

## 2019-02-09 MED ORDER — FLUTICASONE PROPIONATE 50 MCG/ACT NA SUSP: 1.0000 | Freq: Every day | NASAL | 3 refills | Status: DC

## 2019-02-09 MED ORDER — ACYCLOVIR 400 MG PO TABS: 400.0000 mg | ORAL_TABLET | Freq: Two times a day (BID) | ORAL | 0 refills | Status: DC
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
## 2019-02-09 NOTE — Telephone Encounter (Signed)
Per office notes from 4/9, I left a detailed message at the pts cell number to call her insurance company to see what type of glucometer is covered by her insurance, call back and this can be sent in for her.  I also left a message asking her to call back for the transfer of care visit.

## 2019-02-18 ENCOUNTER — Other Ambulatory Visit: Payer: Self-pay | Admitting: Cardiovascular Disease

## 2019-02-27 ENCOUNTER — Encounter: Payer: 59 | Admitting: Family Medicine

## 2019-03-06 ENCOUNTER — Other Ambulatory Visit: Payer: Self-pay | Admitting: Family Medicine

## 2019-03-09 ENCOUNTER — Telehealth: Payer: Self-pay | Admitting: *Deleted

## 2019-03-09 NOTE — Telephone Encounter (Signed)
Video office visit--Mar 10, 2019 at 8:00.  Consent sent via my chart.     Virtual Visit Pre-Appointment Phone Call  "(Name), I am calling you today to discuss your upcoming appointment. We are currently trying to limit exposure to the virus that causes COVID-19 by seeing patients at home rather than in the office."  "What is the BEST phone number to call the day of the visit?" - [phone] 1. "Do you have or have access to (through a family member/friend) a smartphone with video capability that we can use for your visit?" a. If yes - list this number in appt notes as "cell" (if different from BEST phone #) and list the appointment type as a VIDEO visit in appointment notes b. If no - list the appointment type as a PHONE visit in appointment notes  2. Confirm consent - "In the setting of the current Covid19 crisis, you are scheduled for a video visit with your provider on May 8,2020 at 8:00.   Just as we do with many in-office visits, in order for you to participate in this visit, we must obtain consent.  If you'd like, I can send this to your mychart (if signed up) or email for you to review.  Otherwise, I can obtain your verbal consent now.  All virtual visits are billed to your insurance company just like a normal visit would be.  By agreeing to a virtual visit, we'd like you to understand that the technology does not allow for your provider to perform an examination, and thus may limit your provider's ability to fully assess your condition. If your provider identifies any concerns that need to be evaluated in person, we will make arrangements to do so.  Finally, though the technology is pretty good, we cannot assure that it will always work on either your or our end, and in the setting of a video visit, we may have to convert it to a phone-only visit.  In either situation, we cannot ensure that we have a secure connection.  Are you willing to proceed?" STAFF: Did the patient verbally  acknowledge consent to telehealth visit? Document YES/NO here:  Consent sent through my chart.   3. Advise patient to be prepared - "Two hours prior to your appointment, go ahead and check your blood pressure, pulse, oxygen saturation, and your weight (if you have the equipment to check those) and write them all down. When your visit starts, your provider will ask you for this information. If you have an Apple Watch or Kardia device, please plan to have heart rate information ready on the day of your appointment. Please have a pen and paper handy nearby the day of the visit as well."  4. Give patient instructions for MyChart download to smartphone OR Doximity/Doxy.me as below if video visit (depending on what platform provider is using)  5. Inform patient they will receive a phone call 15 minutes prior to their appointment time (may be from unknown caller ID) so they should be prepared to answer    TELEPHONE CALL NOTE  Hannah Parsons has been deemed a candidate for a follow-up tele-health visit to limit community exposure during the Covid-19 pandemic. I spoke with the patient via phone to ensure availability of phone/video source, confirm preferred email & phone number, and discuss instructions and expectations.  I reminded Hannah Parsons to be prepared with any vital sign and/or heart rhythm information that could potentially be obtained  via home monitoring, at the time of her visit. I reminded Hannah Parsons to expect a phone call prior to her visit.  Leodis Liverpool, RN 03/09/2019 2:41 PM   INSTRUCTIONS FOR DOWNLOADING THE MYCHART APP TO SMARTPHONE  - The patient must first make sure to have activated MyChart and know their login information - If Apple, go to [employer] and type in MyChart in the search bar and download the app. If Android, ask patient to go to [employer] and type in [place] in the search bar and download the app. The app is free but as with any other app downloads, their  phone may require them to verify saved payment information or Apple/Android password.  - The patient will need to then log into the app with their MyChart username and password, and select [facility] as their healthcare provider to link the account. When it is time for your visit, go to the MyChart app, find appointments, and click Begin Video Visit. Be sure to Select Allow for your device to access the Microphone and Camera for your visit. You will then be connected, and your provider will be with you shortly.  **If they have any issues connecting, or need assistance please contact MyChart service desk [phone])**  **If using a computer, in order to ensure the best quality for their visit they will need to use either of the following Internet Browsers: [employer], or Google Chrome**  IF USING DOXIMITY or DOXY.ME - The patient will receive a link just prior to their visit by text.     FULL LENGTH CONSENT FOR TELE-HEALTH VISIT   I hereby voluntarily request, consent and authorize [facility] and its employed or contracted physicians, physician assistants, nurse practitioners or other licensed health care professionals (the Practitioner), to provide me with telemedicine health care services (the "Services") as deemed necessary by the treating Practitioner. I acknowledge and consent to receive the Services by the Practitioner via telemedicine. I understand that the telemedicine visit will involve communicating with the Practitioner through live audiovisual communication technology and the disclosure of certain medical information by electronic transmission. I acknowledge that I have been given the opportunity to request an in-person assessment or other available alternative prior to the telemedicine visit and am voluntarily participating in the telemedicine visit.  I understand that I have the right to withhold or withdraw my consent to the use of telemedicine in the course of  my care at any time, without affecting my right to future care or treatment, and that the Practitioner or I may terminate the telemedicine visit at any time. I understand that I have the right to inspect all information obtained and/or recorded in the course of the telemedicine visit and may receive copies of available information for a reasonable fee.  I understand that some of the potential risks of receiving the Services via telemedicine include:  Marland Kitchen Delay or interruption in medical evaluation due to technological equipment failure or disruption; . Information transmitted may not be sufficient (e.g. poor resolution of images) to allow for appropriate medical decision making by the Practitioner; and/or  . In rare instances, security protocols could fail, causing a breach of personal health information.  Furthermore, I acknowledge that it is my responsibility to provide information about my medical history, conditions and care that is complete and accurate to the best of my ability. I acknowledge that Practitioner's advice, recommendations, and/or decision may be based on factors not within their control, such as  incomplete or inaccurate data provided by me or distortions of diagnostic images or specimens that may result from electronic transmissions. I understand that the practice of medicine is not an exact science and that Practitioner makes no warranties or guarantees regarding treatment outcomes. I acknowledge that I will receive a copy of this consent concurrently upon execution via email to the email address I last provided but may also request a printed copy by calling the office of [facility].    I understand that my insurance will be billed for this visit.   I have read or had this consent read to me. . I understand the contents of this consent, which adequately explains the benefits and risks of the Services being provided via telemedicine.  . I have been provided ample opportunity to ask  questions regarding this consent and the Services and have had my questions answered to my satisfaction. . I give my informed consent for the services to be provided through the use of telemedicine in my medical care  By participating in this telemedicine visit I agree to the above.

## 2019-03-10 ENCOUNTER — Other Ambulatory Visit: Payer: Self-pay

## 2019-03-10 ENCOUNTER — Encounter: Payer: Self-pay | Admitting: Cardiovascular Disease

## 2019-03-10 ENCOUNTER — Telehealth (INDEPENDENT_AMBULATORY_CARE_PROVIDER_SITE_OTHER): Payer: 59 | Admitting: Cardiovascular Disease

## 2019-03-10 VITALS — BP 124/72 | HR 78 | Ht 65.5 in | Wt 254.4 lb

## 2019-03-10 DIAGNOSIS — I10 Essential (primary) hypertension: Secondary | ICD-10-CM
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
## 2019-03-10 NOTE — Patient Instructions (Signed)
Medication Instructions:  Your provider recommends that you continue on your current medications as directed. Please refer to the Current Medication list given to you today.    Labwork: None  Testing/Procedures: None  Follow-Up: Your provider wants you to follow-up in: 1 year with Dr. Angelena Form. You will receive a reminder letter in the mail two months in advance. If you don't receive a letter, please call our office to schedule the follow-up appointment.

## 2019-04-04 ENCOUNTER — Other Ambulatory Visit: Payer: Self-pay

## 2019-04-05 ENCOUNTER — Telehealth: Payer: Self-pay | Admitting: Obstetrics and Gynecology

## 2019-04-05 NOTE — Telephone Encounter (Signed)
Left message on voicemail to call and reschedule cancelled appointment. °

## 2019-04-06 ENCOUNTER — Ambulatory Visit: Payer: 59 | Admitting: Obstetrics and Gynecology

## 2019-04-16 IMAGING — MR MR LUMBAR SPINE W/O CM
4 of 5 series · 27 of 48 positions shown · non-contrast
Comparison: Plain films lumbar spine 07/08/2017

CLINICAL DATA: Acute onset low back pain radiating into the right
upper leg on 07/05/2017. Right leg weakness and numbness. No known
injury.

EXAM:
MRI LUMBAR SPINE WITHOUT CONTRAST
TECHNIQUE: Multiplanar, multisequence MR imaging of the lumbar spine was
performed. No intravenous contrast was administered.

[Series 4: T1 · sagittal · 4.0mm · 0.55mm/px · 4 of 15 slices shown (1 of 2)]
[im 1/15]
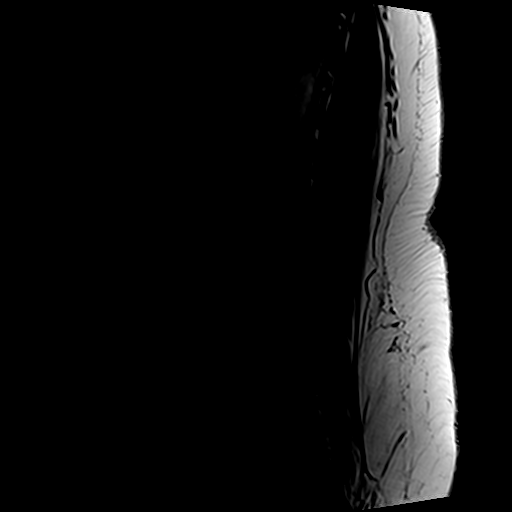
[im 5/15]
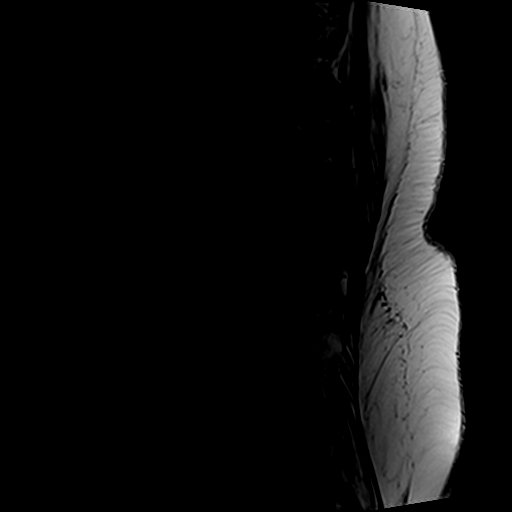
[im 10/15]
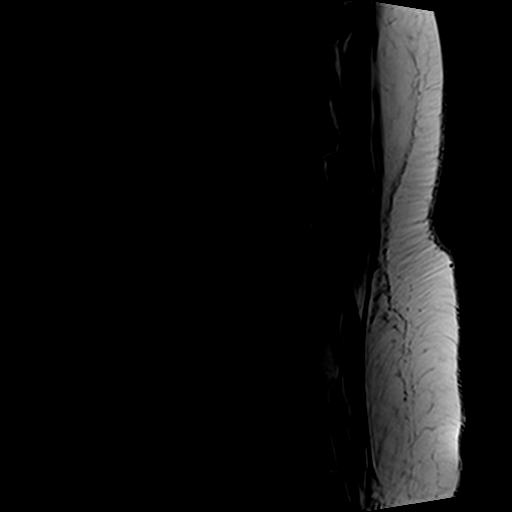
[im 15/15]
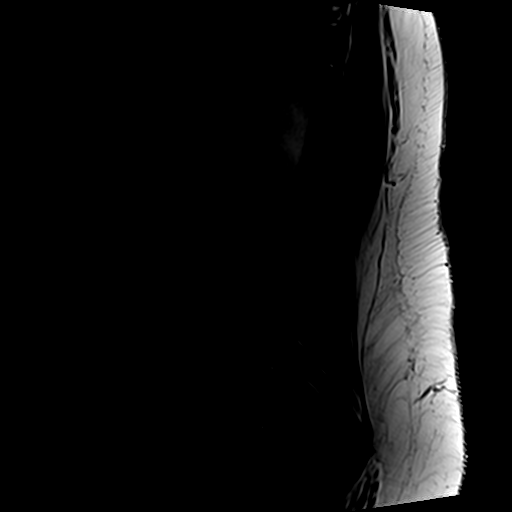

[Series 6: T2 · axial · 4.0mm · 0.70mm/px · z∈[-110,+122]mm · 11 of 51 slices shown (1 of 2)]
[im 4/51]
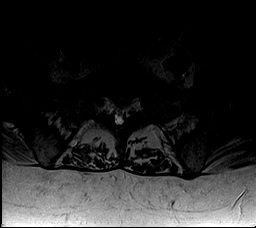
[im 7/51]
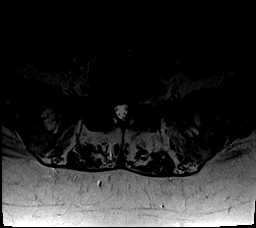
[im 10/51]
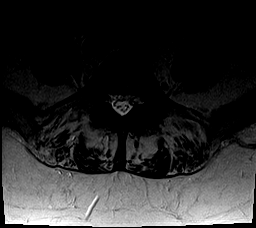
[im 16/51]
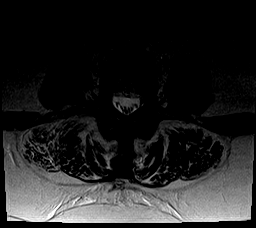
[im 22/51]
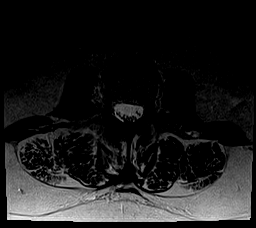
[im 26/51]
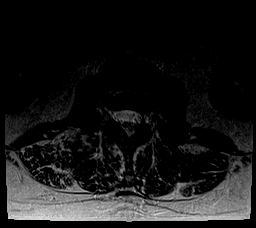
[im 29/51]
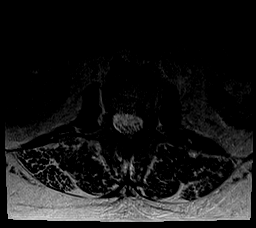
[im 35/51]
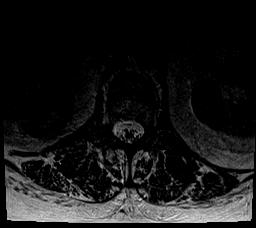
[im 41/51]
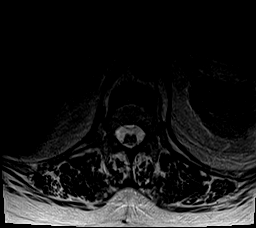
[im 44/51]
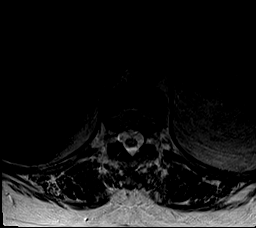
[im 47/51]
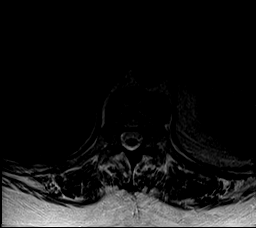

[Series 7: T1 · axial · 4.0mm · 0.35mm/px · z∈[-110,+107]mm · 7 of 51 slices shown (2 of 2)]
[im 4/51]
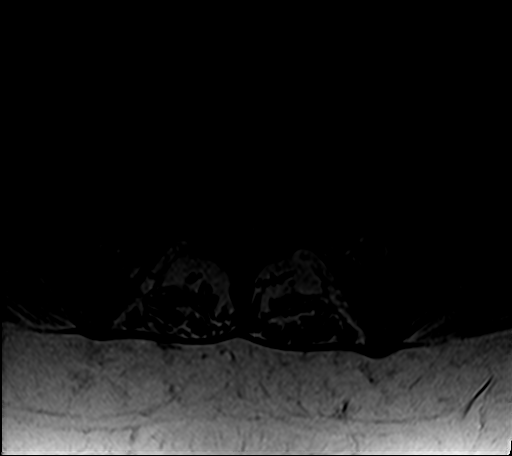
[im 7/51]
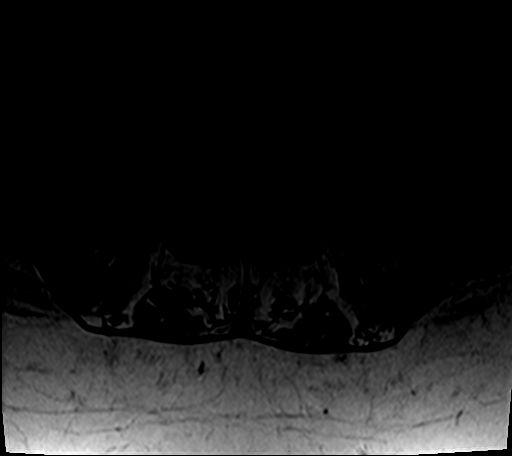
[im 10/51]
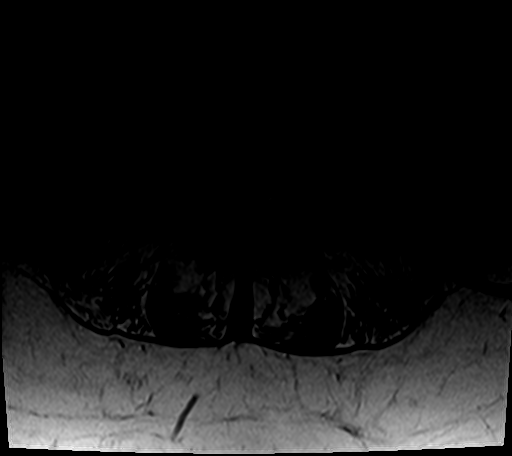
[im 16/51]
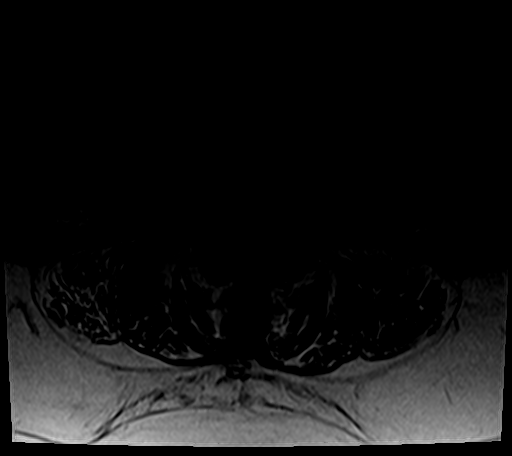
[im 22/51]
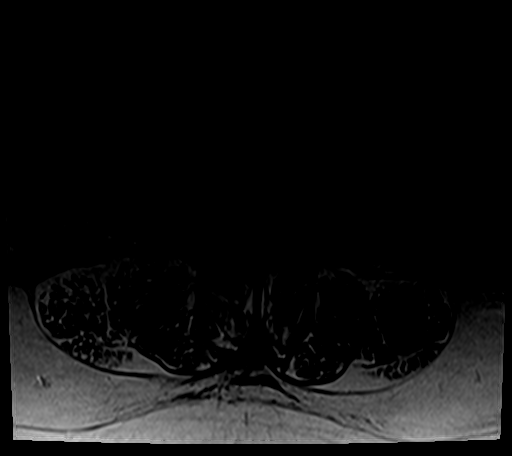
[im 26/51]
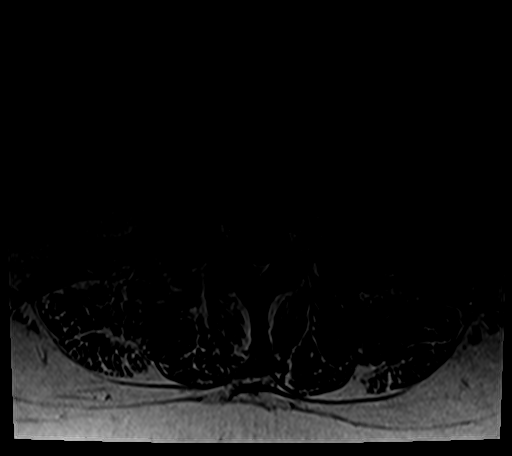
[im 44/51]
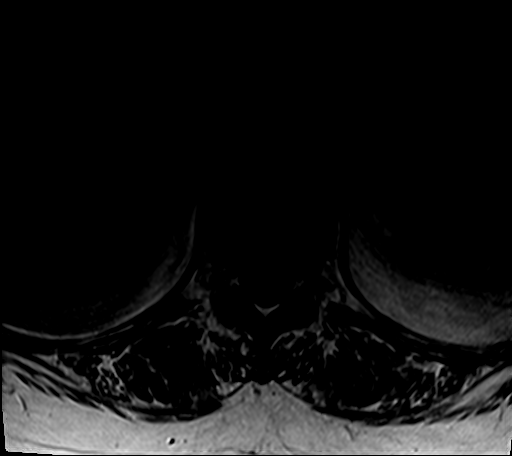

[Series 8: T2 · sagittal · 4.0mm · 0.55mm/px · 5 of 15 slices shown (2 of 2)]
[im 1/15]
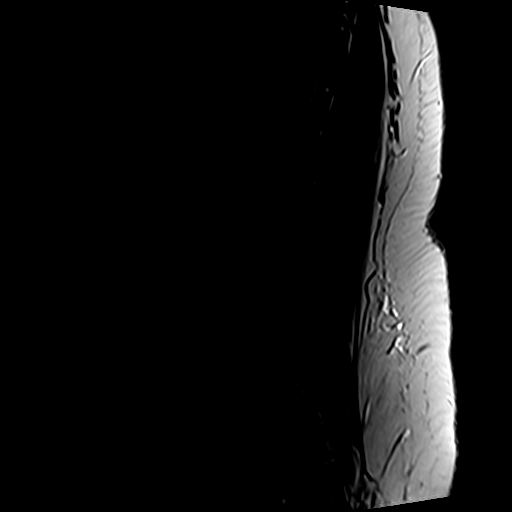
[im 4/15]
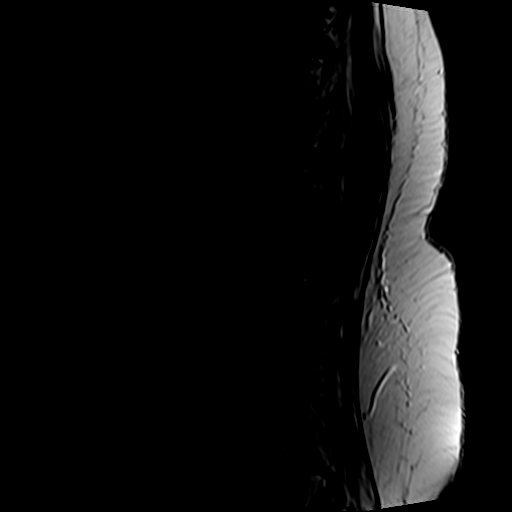
[im 8/15]
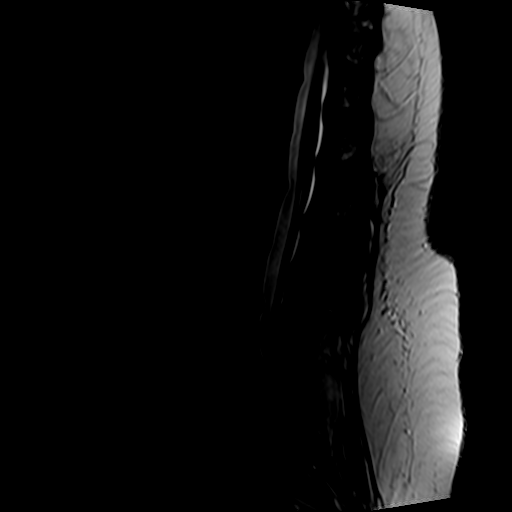
[im 11/15]
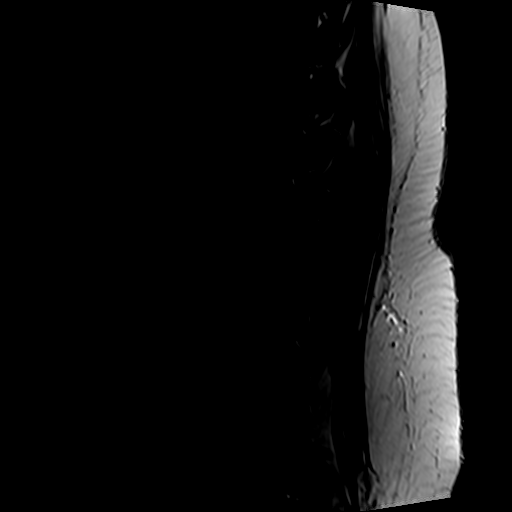
[im 15/15]
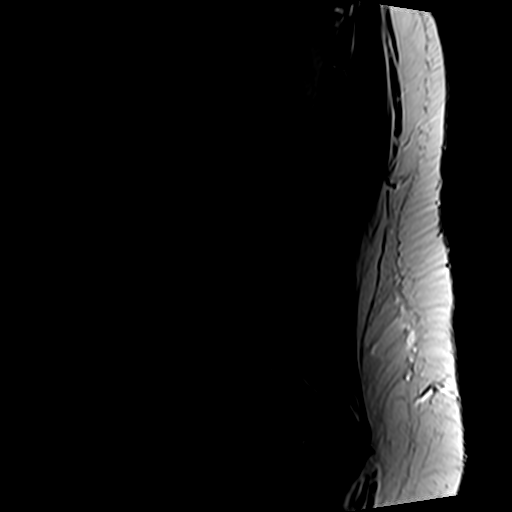

[27 of 48 positions shown; findings below may reference images not displayed]

FINDINGS: Segmentation:  Standard.

Alignment: Facet degenerative disease results in 0.3 cm
anterolisthesis L4 on L5. Alignment is otherwise unremarkable.

Vertebrae:  No fracture or worrisome lesion.

Conus medullaris: Extends to the L1 level and appears normal.

Paraspinal and other soft tissues: Negative.

Disc levels:

T10-11: Ligamentum flavum thickening and a minimal disc bulge
without central canal or foraminal stenosis.

T11-12: Mild facet degenerative change and a very shallow right
paracentral protrusion without stenosis.

T12-L1:  Negative.

L1-2:  Minimal disc bulge.  No stenosis.

L2-3:  Mild facet arthropathy.  Otherwise negative.

L3-4: Disc protrusion in the periphery of the right foramen contacts
the exiting right L3 root. There is a shallow disc bulge at this
level causing mild central canal stenosis. The left foramen is open.

L3-4: Moderate to advanced facet degenerative disease, ligamentum
flavum thickening and a shallow disc bulge are seen. There is
moderately severe to severe central canal and bilateral subarticular
recess narrowing. The foramina are open.

L5-S1: Right subarticular recess disc protrusion impinges on the
descending right S1 root. The disc extends into the right foramen
causing mild foraminal stenosis. The left foramen is open.
IMPRESSION: Disc protrusion in the periphery of the right foramen at L3-4
contacts the exiting right L3 root.

Moderately severe to severe central canal and bilateral subarticular
recess narrowing at L4-5 where advanced facet degenerative disease
results in 0.3 cm anterolisthesis.

Right subarticular recess and foraminal protrusion at L5-S1 impinges
on the descending right S1 root. There is mild right foraminal
narrowing at this level.

## 2019-04-19 ENCOUNTER — Other Ambulatory Visit: Payer: Self-pay | Admitting: Family Medicine

## 2019-04-21 ENCOUNTER — Other Ambulatory Visit: Payer: Self-pay | Admitting: Family Medicine

## 2019-04-25 ENCOUNTER — Other Ambulatory Visit: Payer: Self-pay | Admitting: Family Medicine

## 2019-04-25 DIAGNOSIS — Z1231 Encounter for screening mammogram for malignant neoplasm of breast: Secondary | ICD-10-CM

## 2019-04-28 ENCOUNTER — Other Ambulatory Visit: Payer: Self-pay

## 2019-04-28 ENCOUNTER — Ambulatory Visit
Admission: RE | Admit: 2019-04-28 | Discharge: 2019-04-28 | Disposition: A | Discharge status: Home / Self Care | Payer: 59 | Source: Ambulatory Visit | Attending: Family Medicine | Admitting: Family Medicine

## 2019-04-28 DIAGNOSIS — Z1231 Encounter for screening mammogram for malignant neoplasm of breast: Secondary | ICD-10-CM

## 2019-05-02 ENCOUNTER — Other Ambulatory Visit: Payer: Self-pay | Admitting: Cardiovascular Disease

## 2019-05-04 ENCOUNTER — Other Ambulatory Visit: Payer: Self-pay | Admitting: Family Medicine

## 2019-05-05 ENCOUNTER — Other Ambulatory Visit: Payer: Self-pay | Admitting: Family Medicine

## 2019-05-09 ENCOUNTER — Other Ambulatory Visit: Payer: Self-pay

## 2019-05-09 ENCOUNTER — Encounter: Payer: Self-pay | Admitting: Obstetrics and Gynecology

## 2019-05-09 ENCOUNTER — Ambulatory Visit: Payer: 59 | Admitting: Obstetrics and Gynecology

## 2019-05-09 VITALS — BP 126/68 | HR 80 | Temp 97.2°F | Resp 14 | Ht 66.0 in | Wt 257.0 lb

## 2019-05-09 DIAGNOSIS — Z01419 Encounter for gynecological examination (general) (routine) without abnormal findings: Secondary | ICD-10-CM | POA: Diagnosis not present

## 2019-05-09 DIAGNOSIS — Z780 Asymptomatic menopausal state: Secondary | ICD-10-CM | POA: Diagnosis not present

## 2019-05-09 NOTE — Patient Instructions (Signed)
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
## 2019-07-03 ENCOUNTER — Encounter: Payer: Self-pay | Admitting: *Deleted

## 2019-07-04 ENCOUNTER — Other Ambulatory Visit: Payer: Self-pay | Admitting: Family Medicine

## 2019-07-08 ENCOUNTER — Encounter

## 2019-07-13 ENCOUNTER — Other Ambulatory Visit: Payer: Self-pay | Admitting: Family Medicine

## 2019-07-14 ENCOUNTER — Encounter: Payer: Self-pay | Admitting: Family Medicine

## 2019-07-27 ENCOUNTER — Other Ambulatory Visit: Payer: Self-pay | Admitting: Family Medicine

## 2019-07-29 ENCOUNTER — Encounter

## 2019-08-03 ENCOUNTER — Other Ambulatory Visit: Payer: 59

## 2019-08-04 ENCOUNTER — Other Ambulatory Visit: Payer: Self-pay | Admitting: Family Medicine

## 2019-08-13 ENCOUNTER — Other Ambulatory Visit: Payer: Self-pay | Admitting: Cardiovascular Disease

## 2019-08-20 ENCOUNTER — Other Ambulatory Visit: Payer: Self-pay | Admitting: Family Medicine

## 2019-09-17 ENCOUNTER — Other Ambulatory Visit: Payer: Self-pay | Admitting: Family Medicine

## 2019-10-11 ENCOUNTER — Encounter: Payer: Self-pay | Admitting: Family Medicine

## 2019-10-12 ENCOUNTER — Other Ambulatory Visit: Payer: Self-pay

## 2019-10-12 ENCOUNTER — Telehealth (INDEPENDENT_AMBULATORY_CARE_PROVIDER_SITE_OTHER): Payer: 59 | Admitting: Family Medicine

## 2019-10-12 VITALS — BP 124/70 | Wt 242.0 lb

## 2019-10-12 DIAGNOSIS — F439 Reaction to severe stress, unspecified: Secondary | ICD-10-CM

## 2019-10-12 DIAGNOSIS — E038 Other specified hypothyroidism: Secondary | ICD-10-CM

## 2019-10-12 DIAGNOSIS — E785 Hyperlipidemia, unspecified: Secondary | ICD-10-CM

## 2019-10-12 DIAGNOSIS — E1159 Type 2 diabetes mellitus with other circulatory complications: Secondary | ICD-10-CM | POA: Diagnosis not present

## 2019-10-12 DIAGNOSIS — E119 Type 2 diabetes mellitus without complications: Secondary | ICD-10-CM

## 2019-10-12 DIAGNOSIS — I10 Essential (primary) hypertension: Secondary | ICD-10-CM

## 2019-10-12 DIAGNOSIS — I152 Hypertension secondary to endocrine disorders: Secondary | ICD-10-CM

## 2019-10-12 NOTE — Telephone Encounter (Signed)
Attempted to contact pt, no answer. LMTCB to schedule virtual visit.
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
## 2019-10-13 ENCOUNTER — Other Ambulatory Visit: Payer: Self-pay | Admitting: Family Medicine

## 2019-10-18 ENCOUNTER — Other Ambulatory Visit: Payer: Self-pay

## 2019-10-18 ENCOUNTER — Ambulatory Visit
Admission: RE | Admit: 2019-10-18 | Discharge: 2019-10-18 | Disposition: A | Discharge status: Home / Self Care | Payer: 59 | Source: Ambulatory Visit | Attending: Obstetrics and Gynecology | Admitting: Obstetrics and Gynecology

## 2019-10-18 DIAGNOSIS — Z780 Asymptomatic menopausal state: Secondary | ICD-10-CM

## 2019-10-21 ENCOUNTER — Other Ambulatory Visit: Payer: Self-pay | Admitting: Family Medicine

## 2019-10-24 ENCOUNTER — Other Ambulatory Visit: Payer: Self-pay | Admitting: Family Medicine

## 2019-10-25 ENCOUNTER — Other Ambulatory Visit: Payer: 59

## 2019-11-11 ENCOUNTER — Other Ambulatory Visit: Payer: Self-pay | Admitting: Cardiovascular Disease

## 2019-11-13 ENCOUNTER — Encounter: Payer: Self-pay | Admitting: Pulmonary Disease

## 2019-11-13 ENCOUNTER — Ambulatory Visit (INDEPENDENT_AMBULATORY_CARE_PROVIDER_SITE_OTHER): Payer: 59 | Admitting: Pulmonary Disease

## 2019-11-13 ENCOUNTER — Encounter: Payer: Self-pay | Admitting: Family Medicine

## 2019-11-13 ENCOUNTER — Other Ambulatory Visit: Payer: Self-pay

## 2019-11-13 VITALS — BP 130/68 | HR 69 | Temp 97.0°F | Ht 66.0 in | Wt 248.4 lb

## 2019-11-13 DIAGNOSIS — G4733 Obstructive sleep apnea (adult) (pediatric): Secondary | ICD-10-CM | POA: Diagnosis not present

## 2019-11-13 MED ORDER — LEVOTHYROXINE SODIUM 175 MCG PO TABS: ORAL_TABLET | ORAL | 0 refills | Status: DC

## 2019-11-13 NOTE — Telephone Encounter (Signed)
Rx done. 

## 2019-11-13 NOTE — Progress Notes (Signed)
[facility]  Chief Complaint  Patient presents with  . Consult    Sleep study follow up    Constitutional:  BP 130/68 (BP Location: Right Arm, Cuff Size: Normal)   Pulse 69   Temp (!) 97 F (36.1 C) (Temporal)   Ht 5\' 6"  (1.676 m)   Wt 248 lb 6.4 oz (112.7 kg)   LMP  (LMP Unknown)   SpO2 100% Comment: RA  BMI 40.09 kg/m   Past Medical History:  CAP, DM, Diverticulosis, PUD, HH, Cholelithiasis, Colon polyp, Nephrolithiasis, HLD, HTN, Hypothyroidism, Rectocele  Brief Summary:  Hannah Parsons is a 64 y.o. female with obstructive sleep apnea.    She was enrolled in [facility] program.  Found to have severe sleep apnea and started on auto CPAP.  This program ended and she needs to transition to new provider for management of her sleep apnea.    She uses CPAP nightly.  No issues with mask fit.  Uses full face mask.  Not having sinus congestion, sore throat, dry mouth, or aerophagia.  Sleeps much better, and feels more alert since using CPAP.  She works as an Corporate treasurer for [employer].  Has been using melatonin nightly.  Not using anything to stay awake during the day.  She plans to continue with her current BME for now.    Physical Exam:   Appearance - well kempt   ENMT - clear nasal mucosa, midline nasal  septum, no oral exudates, no LAN, trachea midline  Respiratory - normal chest wall, normal respiratory effort, no accessory muscle use, no wheeze/rales  CV - s1s2 regular rate and rhythm, no murmurs, no peripheral edema, radial pulses symmetric  GI - soft, non tender, no masses  Lymph - no adenopathy noted in neck and axillary areas  MSK - normal gait  Ext - no cyanosis, clubbing, or joint inflammation noted  Skin - no rashes, lesions, or ulcers  Neuro - normal strength, oriented x 3  Psych - normal mood and affect   Assessment/Plan:   Obstructive sleep apnea. - she is compliant with CPAP and reports benefit from  therapy - continue auto CPAP  Obesity. - discussed importance of weight loss and how this can improve control of her sleep apnea   Patient Instructions  Follow up 1 year  A total of  34  minutes were spent face to face and non -face to face with the patient and more than half of that time involved counseling or coordination of care.   Chesley Mires, MD [facility] Pager: [phone] 11/13/2019, 12:06 PM  Flow Sheet    Sleep tests:  07/08/19 >> AHI 34.6, SpO2 low 66% Auto CPAP 07/27/19 to 10/24/19 >> used on 88 of 90 nights with average 8 hrs 35 min.  Average AHI 2.1 with median CPAP 8 and 95 th percentile CPAP 10 cm H2O  Medications:   Allergies as of 11/13/2019      Reactions   Codeine Sulfate    REACTION: hives   Erythromycin Base    REACTION: stomach cramps, flu-like symptoms   Meperidine Hcl    REACTION: vomiting      Medication List       Accurate as of November 13, 2019 12:06 PM. If you have any questions, ask your nurse or doctor.        acyclovir 400 MG tablet Commonly known as: ZOVIRAX TAKE 1 TABLET BY MOUTH TWICE A DAY   b  complex vitamins capsule Take 1 capsule by mouth daily.   [facility] Digestive Probiotic 250 MG capsule Generic drug: saccharomyces boulardii TAKE 1 CAPSULE (250 MG TOTAL) BY MOUTH 2 (TWO) TIMES DAILY.   cyclobenzaprine 5 MG tablet Commonly known as: FLEXERIL TAKE 1 TABLET THREE TIMES A DAY AS NEEDED FOR MUSCLE SPASMS   Diclofenac Sodium 2 % Soln Commonly known as: Pennsaid Place 1 application onto the skin 2 (two) times daily.   diltiazem 120 MG 24 hr capsule Commonly known as: CARDIZEM CD Take 1 capsule (120 mg total) by mouth daily.   famotidine 20 MG tablet Commonly known as: PEPCID Take 20 mg by mouth 2 (two) times daily.   fluticasone 50 MCG/ACT nasal spray Commonly known as: FLONASE PLACE 1 SPRAY INTO BOTH NOSTRILS DAILY.   hydrochlorothiazide 25 MG tablet Commonly known as: HYDRODIURIL TAKE 1  TABLET BY MOUTH EVERY DAY   hydrocortisone 2.5 % rectal cream Commonly known as: Proctosol HC Place 1 application rectally 2 (two) times daily.   levothyroxine 200 MCG tablet Commonly known as: SYNTHROID TAKE 1 TABLET BY MOUTH EVERY DAY BEFORE BREAKFAST   levothyroxine 175 MCG tablet Commonly known as: SYNTHROID TAKE 1 TABLET BY MOUTH EVERY DAY BEFORE BREAKFAST   Melatonin 5 MG Tabs Take 5 mg by mouth at bedtime.   metFORMIN 1000 MG tablet Commonly known as: GLUCOPHAGE TAKE 1 TABLET BY MOUTH TWICE A DAY WITH MEALS   MIRALAX PO Take 17 g by mouth daily.   olmesartan 40 MG tablet Commonly known as: BENICAR TAKE 1 TABLET BY MOUTH DAILY. PLEASE CALL AND SCHEDULE AN APPT FOR FURTHER REFILLS. 1ST ATTEMPT   omeprazole 40 MG capsule Commonly known as: PRILOSEC TAKE 1 CAPSULE BY MOUTH EVERY DAY   Vitamin D3 50 MCG (2000 UT) Tabs Take 2,000 Units by mouth daily.       Past Surgical History:  She  has a past surgical history that includes Dilation and curettage of uterus; Appendectomy; Ovarian cyst removal (1984); Colonoscopy w/ polypectomy (2011); Tear duct probing (10/2009); Cholecystectomy, laparoscopic (05/2011); right leg patella (1994); svd; Wisdom tooth extraction; Laparoscopic assisted vaginal hysterectomy (08/13/2011); Salpingoophorectomy (08/13/2011); Colonoscopy (2014); Upper gi endoscopy (2014); and Polypectomy.  Family History:  Her family history includes Cancer in her mother; Colon cancer (age of onset: 28) in her maternal grandfather; Congestive Heart Failure in her father; Esophageal cancer in her maternal grandmother; Heart disease in her father; Heart failure in her father and paternal grandmother; Lymphoma in her mother; Lymphoma (age of onset: 14) in her maternal grandfather; Stomach cancer in her maternal grandmother.  Social History:  She  reports that she has never smoked. She has never used smokeless tobacco. She reports current alcohol use. She reports that  she does not use drugs.

## 2019-11-13 NOTE — Patient Instructions (Signed)
Follow up 1 year 

## 2020-01-12 ENCOUNTER — Other Ambulatory Visit: Payer: Self-pay
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
## 2020-01-15 ENCOUNTER — Ambulatory Visit (INDEPENDENT_AMBULATORY_CARE_PROVIDER_SITE_OTHER): Payer: 59 | Admitting: Family Medicine

## 2020-01-15 ENCOUNTER — Other Ambulatory Visit: Payer: Self-pay | Admitting: Family Medicine

## 2020-01-15 ENCOUNTER — Encounter: Payer: Self-pay | Admitting: Family Medicine

## 2020-01-15 ENCOUNTER — Other Ambulatory Visit: Payer: Self-pay

## 2020-01-15 VITALS — BP 100/70 | HR 87 | Temp 96.3°F | Ht 66.0 in | Wt 234.0 lb

## 2020-01-15 DIAGNOSIS — Z23 Encounter for immunization: Secondary | ICD-10-CM

## 2020-01-15 DIAGNOSIS — I10 Essential (primary) hypertension: Secondary | ICD-10-CM | POA: Diagnosis not present

## 2020-01-15 DIAGNOSIS — R002 Palpitations: Secondary | ICD-10-CM | POA: Diagnosis not present

## 2020-01-15 DIAGNOSIS — E119 Type 2 diabetes mellitus without complications: Secondary | ICD-10-CM | POA: Diagnosis not present

## 2020-01-15 DIAGNOSIS — E038 Other specified hypothyroidism: Secondary | ICD-10-CM

## 2020-01-15 DIAGNOSIS — E1159 Type 2 diabetes mellitus with other circulatory complications: Secondary | ICD-10-CM

## 2020-01-15 DIAGNOSIS — E785 Hyperlipidemia, unspecified: Secondary | ICD-10-CM

## 2020-01-15 DIAGNOSIS — Z8711 Personal history of peptic ulcer disease: Secondary | ICD-10-CM

## 2020-01-15 DIAGNOSIS — Z8719 Personal history of other diseases of the digestive system: Secondary | ICD-10-CM

## 2020-01-15 DIAGNOSIS — I152 Hypertension secondary to endocrine disorders: Secondary | ICD-10-CM

## 2020-01-15 LAB — TSH: TSH: 0.31 u[IU]/mL — ABNORMAL LOW (ref 0.35–4.50)

## 2020-01-15 LAB — CBC WITH DIFFERENTIAL/PLATELET
Basophils Absolute: 0.1 10*3/uL (ref 0.0–0.1)
Basophils Relative: 0.7 % (ref 0.0–3.0)
Eosinophils Absolute: 0.1 10*3/uL (ref 0.0–0.7)
Eosinophils Relative: 1.7 % (ref 0.0–5.0)
HCT: 39.5 % (ref 36.0–46.0)
Hemoglobin: 13 g/dL (ref 12.0–15.0)
Lymphocytes Relative: 24.5 % (ref 12.0–46.0)
Lymphs Abs: 1.7 10*3/uL (ref 0.7–4.0)
MCHC: 32.8 g/dL (ref 30.0–36.0)
MCV: 91.5 fl (ref 78.0–100.0)
Monocytes Absolute: 0.6 10*3/uL (ref 0.1–1.0)
Monocytes Relative: 7.8 % (ref 3.0–12.0)
Neutro Abs: 4.6 10*3/uL (ref 1.4–7.7)
Neutrophils Relative %: 65.3 % (ref 43.0–77.0)
Platelets: 303 10*3/uL (ref 150.0–400.0)
RBC: 4.32 Mil/uL (ref 3.87–5.11)
RDW: 13.9 % (ref 11.5–15.5)
WBC: 7.1 10*3/uL (ref 4.0–10.5)

## 2020-01-15 LAB — COMPREHENSIVE METABOLIC PANEL
ALT: 23 U/L (ref 0–35)
AST: 17 U/L (ref 0–37)
Albumin: 4.4 g/dL (ref 3.5–5.2)
Alkaline Phosphatase: 70 U/L (ref 39–117)
BUN: 20 mg/dL (ref 6–23)
CO2: 24 mEq/L (ref 19–32)
Calcium: 10.1 mg/dL (ref 8.4–10.5)
Chloride: 99 mEq/L (ref 96–112)
Creatinine, Ser: 0.99 mg/dL (ref 0.40–1.20)
GFR: 56.4 mL/min — ABNORMAL LOW (ref >60.00)
Glucose, Bld: 114 mg/dL — ABNORMAL HIGH (ref 70–99)
Potassium: 4.4 mEq/L (ref 3.5–5.1)
Sodium: 133 mEq/L — ABNORMAL LOW (ref 135–145)
Total Bilirubin: 0.4 mg/dL (ref 0.2–1.2)
Total Protein: 6.9 g/dL (ref 6.0–8.3)

## 2020-01-15 LAB — MICROALBUMIN / CREATININE URINE RATIO
Creatinine,U: 134.6 mg/dL
Microalb Creat Ratio: 0.7 mg/g (ref 0.0–30.0)
Microalb, Ur: 0.9 mg/dL (ref 0.0–1.9)

## 2020-01-15 LAB — HEMOGLOBIN A1C: Hgb A1c MFr Bld: 6 % (ref 4.6–6.5)

## 2020-01-15 LAB — LIPID PANEL
Cholesterol: 152 mg/dL (ref 0–200)
HDL: 48.8 mg/dL (ref >39.00)
NonHDL: 102.74
Total CHOL/HDL Ratio: 3
Triglycerides: 208 mg/dL — ABNORMAL HIGH (ref 0.0–149.0)
VLDL: 41.6 mg/dL — ABNORMAL HIGH (ref 0.0–40.0)

## 2020-01-15 LAB — LDL CHOLESTEROL, DIRECT: Direct LDL: 85 mg/dL

## 2020-01-15 MED ORDER — OMEPRAZOLE 20 MG PO CPDR: 20.0000 mg | DELAYED_RELEASE_CAPSULE | Freq: Every day | ORAL | 3 refills | Status: AC

## 2020-01-15 MED ORDER — ACYCLOVIR 5 % EX OINT: 1.0000 "application " | TOPICAL_OINTMENT | CUTANEOUS | 2 refills | Status: DC

## 2020-01-16 ENCOUNTER — Other Ambulatory Visit: Payer: Self-pay | Admitting: Family Medicine

## 2020-01-16 LAB — D-DIMER, QUANTITATIVE: D-Dimer, Quant: 0.28 mcg/mL FEU (ref <0.50)

## 2020-01-16 NOTE — Telephone Encounter (Signed)
Pt called back and said that it is ok to renew the capsule she already takes and it is ok to giver her the ointment she has used before.

## 2020-01-16 NOTE — Telephone Encounter (Signed)
Sorry I sent another message about this. Pharmacy says ointment not covered which is why they sent back req for capsule? Have them let her know about pricing before filling. Let her know I refilled capsule.

## 2020-01-16 NOTE — Telephone Encounter (Signed)
Pharmacy sent [place] note saying ointment not covered. It is pricey and so that is why I was asking. I think perhaps formulary changed? If she needs refill on capsule and it was good dose previously, then ok to send refill. If she also wants ointment let pharmacy know but they may want to tell her about price before filling.  (see results note)

## 2020-01-16 NOTE — Telephone Encounter (Signed)
Left a detailed message for the pt to call back with info as below.

## 2020-01-17 NOTE — Telephone Encounter (Signed)
See prior note

## 2020-01-17 NOTE — Telephone Encounter (Signed)
Spoke with Jinny Blossom at the pharmacy and she stated the Rx received was for Acyclovir cream, which for cast price is $859 for a 5gm tube and they did not receive an Rx for an ointment?  Message sent to PCP.

## 2020-01-17 NOTE — Telephone Encounter (Signed)
[facility] called [facility] and spoke with the pharmacist Robin and informed her of the message below.  Order for the prescription of Acyclovir ointment from 3/15 was given verbally and Shirlean Mylar stated this will be filled.

## 2020-01-17 NOTE — Telephone Encounter (Signed)
Our script says ointment. So please just call in verbal refill of ointment then. It says ointment on what I sent from our side.

## 2020-02-06 ENCOUNTER — Other Ambulatory Visit: Payer: Self-pay | Admitting: Cardiovascular Disease

## 2020-02-08 ENCOUNTER — Other Ambulatory Visit: Payer: Self-pay | Admitting: Cardiovascular Disease

## 2020-02-29 ENCOUNTER — Encounter: Payer: Self-pay | Admitting: *Deleted

## 2020-03-01 ENCOUNTER — Encounter: Payer: Self-pay | Admitting: Physician Assistant

## 2020-03-01 ENCOUNTER — Other Ambulatory Visit: Payer: Self-pay

## 2020-03-01 ENCOUNTER — Ambulatory Visit: Payer: 59 | Admitting: Physician Assistant

## 2020-03-01 ENCOUNTER — Telehealth: Payer: Self-pay | Admitting: Physician Assistant

## 2020-03-01 DIAGNOSIS — Z1283 Encounter for screening for malignant neoplasm of skin: Secondary | ICD-10-CM

## 2020-03-01 DIAGNOSIS — L820 Inflamed seborrheic keratosis: Secondary | ICD-10-CM

## 2020-03-01 NOTE — Telephone Encounter (Signed)
OTC lotrimin Ultra

## 2020-03-01 NOTE — Progress Notes (Addendum)
   Follow-Up Visit   Subjective  Hannah Parsons is a 64 y.o. female who presents for the following: Annual Exam (yearly ). Numerous brown crusts under breasts and abdomen that are irritated and itchy. She is getting a lot of them all over. No history of NMSC or MM.  The following portions of the chart were reviewed this encounter and updated as appropriate: Tobacco  Allergies  Meds  Problems  Med Hx  Surg Hx  Fam Hx      Objective  Well appearing patient in no apparent distress; mood and affect are within normal limits.  A full examination was performed including scalp, head, eyes, ears, nose, lips, neck, chest, axillae, abdomen, back, buttocks, bilateral upper extremities, bilateral lower extremities, hands, feet, fingers, toes, fingernails, and toenails. All findings within normal limits unless otherwise noted below.  Objective  Left Inframammary Fold (10), Left Thigh - Anterior (11), Pubic (8), Right Abdomen (side) - Upper (4): Erythematous stuck-on, waxy papule or plaque.   Objective  TBSE: No DN or signs of NMSC  Assessment & Plan  Screening exam for skin cancer TBSE No atypical nevi noted at the time of the visit. observe  Seborrheic keratoses, inflamed (33)  Destruction of lesion -Left Thigh - Anterior (11); Right Abdomen (side) - Upper (4); Pubic (8); Left Inframammary Fold (10) Complexity: simple   Destruction method: cryotherapy   Informed consent: discussed and consent obtained   Timeout:  patient name, date of birth, surgical site, and procedure verified Lesion destroyed using liquid nitrogen: Yes   Cryotherapy cycles:  1 Outcome: patient tolerated procedure well with no complications   Post-procedure details: wound care instructions given     I, Ponciano Shealy, PA-C, have reviewed all documentation for this visit. The documentation on 04/17/20 for the exam, diagnosis, procedures, and orders are all accurate and complete.

## 2020-03-01 NOTE — Telephone Encounter (Signed)
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 Will [facility] [address]? Patient says to leave a detailed message if does not answer. Chart 4040

## 2020-03-04 NOTE — Telephone Encounter (Signed)
Left message for patient to call us back.  

## 2020-03-04 NOTE — Telephone Encounter (Signed)
Left message for patient

## 2020-03-04 NOTE — Telephone Encounter (Signed)
It is the nature of intertrigo to be chronic intermittent. RX ketoconazole cream 6 rf. Qd-bid

## 2020-03-07 MED ORDER — KETOCONAZOLE 2 % EX CREA: 1.0000 "application " | TOPICAL_CREAM | Freq: Two times a day (BID) | CUTANEOUS | 6 refills | Status: AC

## 2020-03-14 ENCOUNTER — Other Ambulatory Visit: Payer: Self-pay | Admitting: Family Medicine

## 2020-03-14 ENCOUNTER — Ambulatory Visit: Payer: 59 | Admitting: Physician Assistant

## 2020-03-14 DIAGNOSIS — Z1231 Encounter for screening mammogram for malignant neoplasm of breast: Secondary | ICD-10-CM
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
## 2020-03-18 ENCOUNTER — Ambulatory Visit: Payer: 59 | Admitting: Cardiovascular Disease

## 2020-03-18 ENCOUNTER — Other Ambulatory Visit: Payer: Self-pay

## 2020-03-18 ENCOUNTER — Encounter: Payer: Self-pay | Admitting: Cardiovascular Disease

## 2020-03-18 VITALS — BP 116/70 | HR 72 | Ht 66.0 in | Wt 225.8 lb

## 2020-03-18 DIAGNOSIS — I10 Essential (primary) hypertension: Secondary | ICD-10-CM

## 2020-03-18 NOTE — Patient Instructions (Signed)
Medication Instructions:  Your physician has recommended you make the following change in your medication:  1.) stop HCTZ  *If you need a refill on your cardiac medications before your next appointment, please call your pharmacy*   Lab Work: none If you have labs (blood work) drawn today and your tests are completely normal, you will receive your results only by: Marland Kitchen MyChart Message (if you have MyChart) OR . A paper copy in the mail If you have any lab test that is abnormal or we need to change your treatment, we will call you to review the results.   Testing/Procedures: none   Follow-Up: At [facility], you and your health needs are our priority.  As part of our continuing mission to provide you with exceptional heart care, we have created designated Provider Care Teams.  These Care Teams include your primary Cardiologist (physician) and Advanced Practice Providers (APPs -  Physician Assistants and Nurse Practitioners) who all work together to provide you with the care you need, when you need it.  Your next appointment:   12 month(s)  The format for your next appointment:   Either In Person or Virtual  Provider:   You may see Lauree Chandler, MD or one of the following Advanced Practice Providers on your designated Care Team:    Melina Copa, PA-C  Ermalinda Barrios, PA-C    Other Instructions

## 2020-03-18 NOTE — Addendum Note (Signed)
Addended by: Rodman Key on: 03/18/2020 10:36 AM   Modules accepted: Orders

## 2020-03-20 ENCOUNTER — Other Ambulatory Visit: Payer: Self-pay | Admitting: Family Medicine

## 2020-04-13 NOTE — Addendum Note (Signed)
Addended by: Robyne Askew R on: 04/13/2020 03:28 PM   Modules accepted: Level of Service

## 2020-04-23 ENCOUNTER — Other Ambulatory Visit: Payer: Self-pay

## 2020-04-23 LAB — HM DIABETES EYE EXAM

## 2020-04-24 ENCOUNTER — Encounter: Payer: Self-pay | Admitting: Family Medicine

## 2020-04-24 ENCOUNTER — Ambulatory Visit (INDEPENDENT_AMBULATORY_CARE_PROVIDER_SITE_OTHER): Payer: 59 | Admitting: Family Medicine

## 2020-04-24 ENCOUNTER — Ambulatory Visit: Payer: Self-pay | Admitting: Family Medicine

## 2020-04-24 VITALS — BP 102/60 | HR 72 | Temp 96.5°F | Ht 66.0 in | Wt 223.4 lb

## 2020-04-24 DIAGNOSIS — E1159 Type 2 diabetes mellitus with other circulatory complications: Secondary | ICD-10-CM

## 2020-04-24 DIAGNOSIS — I152 Hypertension secondary to endocrine disorders: Secondary | ICD-10-CM

## 2020-04-24 DIAGNOSIS — E785 Hyperlipidemia, unspecified: Secondary | ICD-10-CM

## 2020-04-24 DIAGNOSIS — I10 Essential (primary) hypertension: Secondary | ICD-10-CM

## 2020-04-24 DIAGNOSIS — K219 Gastro-esophageal reflux disease without esophagitis: Secondary | ICD-10-CM

## 2020-04-24 DIAGNOSIS — Z23 Encounter for immunization: Secondary | ICD-10-CM

## 2020-04-24 DIAGNOSIS — E1169 Type 2 diabetes mellitus with other specified complication: Secondary | ICD-10-CM | POA: Diagnosis not present

## 2020-04-24 DIAGNOSIS — E119 Type 2 diabetes mellitus without complications: Secondary | ICD-10-CM | POA: Insufficient documentation

## 2020-04-24 DIAGNOSIS — E038 Other specified hypothyroidism: Secondary | ICD-10-CM | POA: Diagnosis not present

## 2020-04-24 LAB — TSH: TSH: 0.8 u[IU]/mL (ref 0.35–4.50)

## 2020-04-24 LAB — HEMOGLOBIN A1C: Hgb A1c MFr Bld: 5.8 % (ref 4.6–6.5)

## 2020-04-24 NOTE — Progress Notes (Signed)
Hannah Parsons DOB: 01-12-1956 Encounter date: 04/24/2020  This is a 64 y.o. female who presents with No chief complaint on file.   History of present illness:  Energy level is good overall; no aches or pains. Eating healthy - really watches food during the day at work and watches what she eats at home, but allows herself to eat when she is out. Doesn't exercise outside of work. 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   Sleep apnea: using cpap nightly. Feels well rested in morning.   Palpitations: not had any.   Hypertension: Follows with cardiology regularly due to family history. Has been running well at home. He told her she could leave off the hctz, but she feels like she does retain some fluid. She is basically taking this on and off. Systolic hasn't been lower than 100. Been ok with not feeling light headed/dizzy. Still taking diltiazem.   GERD: Omeprazole 40 mg, decreased to 20 mg at last visit. Has done well with the 20mg .   Hypothyroid: after last labs we decreased synthroid just slight to half tab one day/week. Still has hypothyroid sx; but does best when in lower end of normal range.   Type 2 diabetes: Metformin; well controlled. A1C has been less than 6.5 since 2017.  Associated with hypothyroid and GERD.  Prefers not to take statin.   Allergies  Allergen Reactions  . Codeine Sulfate     REACTION: hives  . Erythromycin Base     REACTION: stomach cramps, flu-like symptoms  . Meperidine Hcl     REACTION: vomiting   Current Meds  Medication Sig  . acyclovir (ZOVIRAX) 400 MG tablet TAKE 1 TABLET BY MOUTH TWICE A DAY  . acyclovir ointment (ZOVIRAX) 5 % Apply 1 application topically every 3 (three) hours.  Marland Kitchen b complex vitamins capsule Take 1 capsule by mouth daily.  . Cholecalciferol (VITAMIN D3) 2000 UNITS TABS Take 2,000 Units by mouth daily.    . CVS DIGESTIVE PROBIOTIC 250 MG capsule TAKE 1 CAPSULE (250 MG TOTAL) BY MOUTH 2 (TWO) TIMES DAILY.  .  cyclobenzaprine (FLEXERIL) 5 MG tablet TAKE 1 TABLET THREE TIMES A DAY AS NEEDED FOR MUSCLE SPASMS  . diltiazem (CARDIZEM CD) 120 MG 24 hr capsule TAKE 1 CAPSULE BY MOUTH EVERY DAY  . famotidine (PEPCID) 20 MG tablet Take 20 mg by mouth 2 (two) times daily.  . fluticasone (FLONASE) 50 MCG/ACT nasal spray PLACE 1 SPRAY INTO BOTH NOSTRILS DAILY.  Marland Kitchen levothyroxine (SYNTHROID) 175 MCG tablet TAKE 1 TABLET BY MOUTH EVERY DAY BEFORE BREAKFAST  . Melatonin 5 MG TABS Take 5 mg by mouth at bedtime.    . metFORMIN (GLUCOPHAGE) 1000 MG tablet TAKE 1 TABLET BY MOUTH TWICE A DAY WITH MEALS  . olmesartan (BENICAR) 40 MG tablet TAKE 1 TABLET BY MOUTH DAILY. PLEASE CALL AND SCHEDULE AN APPT FOR FURTHER REFILLS. 1ST ATTEMPT  . omeprazole (PRILOSEC) 20 MG capsule Take 1 capsule (20 mg total) by mouth daily.  . Polyethylene Glycol 3350 (MIRALAX PO) Take 17 g by mouth daily.    Current Facility-Administered Medications for the 04/24/20 encounter (Office Visit) with Caren Macadam, MD  Medication  . 0.9 %  sodium chloride infusion    Review of Systems  Constitutional: Negative for chills, fatigue and fever.  Respiratory: Negative for cough, chest tightness, shortness of breath and wheezing.   Cardiovascular: Negative for chest pain, palpitations and leg swelling.    Objective:  BP 102/60 (BP Location:  Left Arm, Patient Position: Sitting, Cuff Size: Large)   Pulse 72   Temp (!) 96.5 F (35.8 C) (Temporal)   Ht 5\' 6"  (1.676 m)   Wt 223 lb 6.4 oz (101.3 kg)   LMP  (LMP Unknown)   BMI 36.06 kg/m   Weight: 223 lb 6.4 oz (101.3 kg)   BP Readings from Last 3 Encounters:  04/24/20 102/60  03/18/20 116/70  01/15/20 100/70   Wt Readings from Last 3 Encounters:  04/24/20 223 lb 6.4 oz (101.3 kg)  03/18/20 225 lb 12.8 oz (102.4 kg)  01/15/20 234 lb (106.1 kg)    Physical Exam Constitutional:      General: She is not in acute distress.    Appearance: She is well-developed.  Cardiovascular:      Rate and Rhythm: Normal rate and regular rhythm.     Heart sounds: Normal heart sounds. No murmur heard.  No friction rub.  Pulmonary:     Effort: Pulmonary effort is normal. No respiratory distress.     Breath sounds: Normal breath sounds. No wheezing or rales.  Musculoskeletal:     Right lower leg: No edema.     Left lower leg: No edema.  Neurological:     Mental Status: She is alert and oriented to person, place, and time.  Psychiatric:        Behavior: Behavior normal.     Assessment/Plan  1. Hypertension associated with diabetes ([place]) Well controlled.   2. Other specified hypothyroidism We are going to recheck TSH today and adjust levothyroxine pending this. - TSH; Future  3. Controlled type 2 diabetes mellitus with other specified complication, without long-term current use of insulin ([facility]) Continue with healthy eating and high activity level. - Hemoglobin A1c; Future  4. Hyperlipidemia, unspecified hyperlipidemia type Diet controlled. We will recheck at next visit. Prefers to avoid statin if possible.  5. Gastroesophageal reflux disease, unspecified whether esophagitis present Continue with omeprazole 20mg .    Return in about 6 months (around 10/24/2020) for physical exam.    Micheline Rough, MD

## 2020-04-24 NOTE — Addendum Note (Signed)
Addended by: Marrion Coy on: 04/24/2020 10:14 AM   Modules accepted: Orders

## 2020-04-24 NOTE — Addendum Note (Signed)
Addended by: Agnes Lawrence on: 04/24/2020 11:23 AM   Modules accepted: Orders

## 2020-04-29 ENCOUNTER — Ambulatory Visit: Payer: Self-pay | Admitting: Family Medicine

## 2020-05-02 ENCOUNTER — Other Ambulatory Visit: Payer: Self-pay | Admitting: Cardiovascular Disease

## 2020-05-06 ENCOUNTER — Other Ambulatory Visit: Payer: Self-pay | Admitting: Cardiovascular Disease

## 2020-05-08 ENCOUNTER — Ambulatory Visit
Admission: RE | Admit: 2020-05-08 | Discharge: 2020-05-08 | Disposition: A | Discharge status: Home / Self Care | Payer: 59 | Source: Ambulatory Visit | Attending: Family Medicine | Admitting: Family Medicine

## 2020-05-08 ENCOUNTER — Other Ambulatory Visit: Payer: Self-pay

## 2020-05-08 DIAGNOSIS — Z1231 Encounter for screening mammogram for malignant neoplasm of breast: Secondary | ICD-10-CM
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
## 2020-05-09 ENCOUNTER — Other Ambulatory Visit (HOSPITAL_COMMUNITY)
Admission: RE | Admit: 2020-05-09 | Discharge: 2020-05-09 | Disposition: A | Discharge status: Home / Self Care | Payer: 59 | Source: Ambulatory Visit | Attending: Obstetrics and Gynecology | Admitting: Obstetrics and Gynecology

## 2020-05-09 ENCOUNTER — Ambulatory Visit: Payer: 59 | Admitting: Obstetrics and Gynecology

## 2020-05-09 ENCOUNTER — Other Ambulatory Visit: Payer: Self-pay

## 2020-05-09 ENCOUNTER — Encounter: Payer: Self-pay | Admitting: Obstetrics and Gynecology

## 2020-05-09 VITALS — BP 122/64 | HR 70 | Resp 14 | Ht 66.0 in | Wt 221.2 lb

## 2020-05-09 DIAGNOSIS — Z113 Encounter for screening for infections with a predominantly sexual mode of transmission: Secondary | ICD-10-CM

## 2020-05-09 DIAGNOSIS — Z01419 Encounter for gynecological examination (general) (routine) without abnormal findings: Secondary | ICD-10-CM | POA: Diagnosis not present

## 2020-05-09 NOTE — Patient Instructions (Signed)

## 2020-05-10 LAB — CERVICOVAGINAL ANCILLARY ONLY
Chlamydia: NEGATIVE
Comment: NEGATIVE
Comment: NEGATIVE
Comment: NORMAL
Neisseria Gonorrhea: NEGATIVE
Trichomonas: NEGATIVE

## 2020-05-10 LAB — HEPATITIS C ANTIBODY: Hep C Virus Ab: <0.1 s/co ratio (ref 0.0–0.9)

## 2020-05-10 LAB — HEP, RPR, HIV PANEL
HIV Screen 4th Generation wRfx: NONREACTIVE
Hepatitis B Surface Ag: NEGATIVE
RPR Ser Ql: NONREACTIVE

## 2020-05-10 LAB — HSV(HERPES SIMPLEX VRS) I + II AB-IGG
HSV 1 Glycoprotein G Ab, IgG: 38.3 index — ABNORMAL HIGH (ref 0.00–0.90)
HSV 2 IgG, Type Spec: 8.92 index — ABNORMAL HIGH (ref 0.00–0.90)

## 2020-05-13 ENCOUNTER — Encounter: Payer: Self-pay | Admitting: Obstetrics and Gynecology

## 2020-05-14 ENCOUNTER — Encounter: Payer: Self-pay | Admitting: Family Medicine

## 2020-05-15 ENCOUNTER — Ambulatory Visit: Payer: 59

## 2020-05-24 ENCOUNTER — Other Ambulatory Visit: Payer: Self-pay

## 2020-05-24 MED ORDER — DILTIAZEM HCL ER COATED BEADS 120 MG PO CP24: 120.0000 mg | ORAL_CAPSULE | Freq: Every day | ORAL | 2 refills | Status: AC

## 2020-06-16 ENCOUNTER — Other Ambulatory Visit: Payer: Self-pay | Admitting: Family Medicine

## 2020-08-02 DEATH — deceased

## 2020-09-06 ENCOUNTER — Ambulatory Visit: Payer: 59 | Admitting: Physician Assistant

## 2020-09-09 ENCOUNTER — Other Ambulatory Visit: Payer: Self-pay | Admitting: Family Medicine

## 2020-11-12 ENCOUNTER — Ambulatory Visit: Payer: 59 | Admitting: Physician Assistant
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
## 2021-06-04 ENCOUNTER — Ambulatory Visit: Payer: 59 | Admitting: Obstetrics and Gynecology

## 2021-06-04 DIAGNOSIS — Z0289 Encounter for other administrative examinations: Secondary | ICD-10-CM

## 2024-02-02 ENCOUNTER — Encounter: Payer: Self-pay | Admitting: Internal Medicine
